# Patient Record
Sex: Male | Born: 1965 | Race: White | Hispanic: No | Marital: Married | Smoking: Never smoker
Health system: Southern US, Community
[De-identification: ages and names within clinical notes are randomized; demographics above are authoritative.]

## PROBLEM LIST (undated history)

## (undated) DIAGNOSIS — I10 Essential (primary) hypertension: Secondary | ICD-10-CM

## (undated) DIAGNOSIS — J45909 Unspecified asthma, uncomplicated: Secondary | ICD-10-CM

## (undated) HISTORY — PX: FOOT SURGERY: SHX648

## (undated) HISTORY — DX: Essential (primary) hypertension: I10

## (undated) HISTORY — DX: Unspecified asthma, uncomplicated: J45.909

---

## 2011-04-28 ENCOUNTER — Emergency Department: Payer: Self-pay | Admitting: Emergency Medicine

## 2015-06-12 DIAGNOSIS — R202 Paresthesia of skin: Secondary | ICD-10-CM | POA: Diagnosis not present

## 2015-06-12 DIAGNOSIS — I10 Essential (primary) hypertension: Secondary | ICD-10-CM | POA: Diagnosis not present

## 2015-06-12 DIAGNOSIS — E0590 Thyrotoxicosis, unspecified without thyrotoxic crisis or storm: Secondary | ICD-10-CM | POA: Diagnosis not present

## 2015-06-12 DIAGNOSIS — R7309 Other abnormal glucose: Secondary | ICD-10-CM | POA: Diagnosis not present

## 2016-02-17 DIAGNOSIS — L309 Dermatitis, unspecified: Secondary | ICD-10-CM | POA: Diagnosis not present

## 2016-02-17 DIAGNOSIS — C4442 Squamous cell carcinoma of skin of scalp and neck: Secondary | ICD-10-CM | POA: Diagnosis not present

## 2016-02-17 DIAGNOSIS — D485 Neoplasm of uncertain behavior of skin: Secondary | ICD-10-CM | POA: Diagnosis not present

## 2016-03-12 DIAGNOSIS — L905 Scar conditions and fibrosis of skin: Secondary | ICD-10-CM | POA: Diagnosis not present

## 2016-03-12 DIAGNOSIS — C4442 Squamous cell carcinoma of skin of scalp and neck: Secondary | ICD-10-CM | POA: Diagnosis not present

## 2016-05-11 DIAGNOSIS — D1801 Hemangioma of skin and subcutaneous tissue: Secondary | ICD-10-CM | POA: Diagnosis not present

## 2016-05-11 DIAGNOSIS — L570 Actinic keratosis: Secondary | ICD-10-CM | POA: Diagnosis not present

## 2016-05-11 DIAGNOSIS — L814 Other melanin hyperpigmentation: Secondary | ICD-10-CM | POA: Diagnosis not present

## 2016-05-11 DIAGNOSIS — Z85828 Personal history of other malignant neoplasm of skin: Secondary | ICD-10-CM | POA: Diagnosis not present

## 2016-05-11 DIAGNOSIS — L821 Other seborrheic keratosis: Secondary | ICD-10-CM | POA: Diagnosis not present

## 2016-06-10 DIAGNOSIS — R062 Wheezing: Secondary | ICD-10-CM | POA: Diagnosis not present

## 2016-06-10 DIAGNOSIS — R202 Paresthesia of skin: Secondary | ICD-10-CM | POA: Diagnosis not present

## 2016-06-10 DIAGNOSIS — I10 Essential (primary) hypertension: Secondary | ICD-10-CM | POA: Diagnosis not present

## 2016-06-18 ENCOUNTER — Encounter: Payer: Self-pay | Admitting: Podiatry

## 2016-06-18 ENCOUNTER — Ambulatory Visit (INDEPENDENT_AMBULATORY_CARE_PROVIDER_SITE_OTHER): Payer: BLUE CROSS/BLUE SHIELD | Admitting: Podiatry

## 2016-06-18 ENCOUNTER — Ambulatory Visit (INDEPENDENT_AMBULATORY_CARE_PROVIDER_SITE_OTHER): Payer: BLUE CROSS/BLUE SHIELD

## 2016-06-18 VITALS — BP 124/73 | HR 76

## 2016-06-18 DIAGNOSIS — M79672 Pain in left foot: Secondary | ICD-10-CM | POA: Diagnosis not present

## 2016-06-18 DIAGNOSIS — M79671 Pain in right foot: Secondary | ICD-10-CM

## 2016-06-18 DIAGNOSIS — M779 Enthesopathy, unspecified: Secondary | ICD-10-CM | POA: Diagnosis not present

## 2016-06-18 NOTE — Progress Notes (Signed)
   Subjective:    Patient ID: Jonathan GuestBrian Hunter, male    DOB: Mar 16, 1965, 51 y.o.   MRN: [id number]  HPI Chief Complaint  Patient presents with  . Foot Pain    B/L foot pain and need orthotic      Review of Systems  Musculoskeletal: Positive for gait problem.       Objective:   Physical Exam        Assessment & Plan:

## 2016-06-18 NOTE — Progress Notes (Signed)
Subjective:    Patient ID: Jonathan GuestBrian Hunter, male   DOB: 51 y.o.   MRN: [id number]   HPI patient presents stating he's worn orthotics for years and his feet have started to hurt and he gets numbing and tingling pain in his feet for about the last year    Review of Systems  All other systems reviewed and are negative.       Objective:  Physical Exam  Constitutional: He is oriented to person, place, and time.  Cardiovascular: Intact distal pulses.   Pulmonary/Chest: Effort normal.  Musculoskeletal: Normal range of motion.  Neurological: He is alert and oriented to person, place, and time.  Skin: Skin is warm.  Nursing note and vitals reviewed.  neurovascular status intact muscle strength adequate range of motion within normal limits with patient noted to have mild tingling of the distal toes bilateral with no muscle strength loss. Range of motion was adequate and no other pathology was noted with moderate depression of the arch and chronic tendinitis with history of navicular fracture right     Assessment:    Chronic tendinitis with fracture right of the navicular that's healed with orthotics which been very beneficial and possibility for low-grade neuropathy     Plan:    H&P x-rays reviewed and today we scanned for custom orthotics to lift the plantar arch and take pressure off the navicular. I then discussed neuropathy and we discussed possibility long-term for nerve biopsy which she does not want at the current time and I'm getting a have him start high-dose vitamin complex and discussed him starting  X-rays indicate there is no indications of stress fracture or advanced arthritis

## 2016-06-22 DIAGNOSIS — L570 Actinic keratosis: Secondary | ICD-10-CM | POA: Diagnosis not present

## 2016-07-13 ENCOUNTER — Ambulatory Visit: Payer: BLUE CROSS/BLUE SHIELD | Admitting: Orthotics

## 2016-08-07 DIAGNOSIS — R7989 Other specified abnormal findings of blood chemistry: Secondary | ICD-10-CM | POA: Diagnosis not present

## 2016-08-07 DIAGNOSIS — R202 Paresthesia of skin: Secondary | ICD-10-CM | POA: Diagnosis not present

## 2016-08-14 ENCOUNTER — Encounter: Payer: Self-pay | Admitting: Neurology

## 2016-08-24 DIAGNOSIS — M25571 Pain in right ankle and joints of right foot: Secondary | ICD-10-CM | POA: Diagnosis not present

## 2016-08-31 DIAGNOSIS — M25571 Pain in right ankle and joints of right foot: Secondary | ICD-10-CM | POA: Diagnosis not present

## 2016-08-31 DIAGNOSIS — Q742 Other congenital malformations of lower limb(s), including pelvic girdle: Secondary | ICD-10-CM | POA: Diagnosis not present

## 2016-08-31 DIAGNOSIS — I10 Essential (primary) hypertension: Secondary | ICD-10-CM | POA: Diagnosis not present

## 2016-08-31 DIAGNOSIS — Z23 Encounter for immunization: Secondary | ICD-10-CM | POA: Diagnosis not present

## 2016-08-31 DIAGNOSIS — L570 Actinic keratosis: Secondary | ICD-10-CM | POA: Diagnosis not present

## 2016-09-10 ENCOUNTER — Other Ambulatory Visit: Payer: Self-pay | Admitting: Family Medicine

## 2016-09-11 ENCOUNTER — Other Ambulatory Visit: Payer: Self-pay | Admitting: Family Medicine

## 2016-09-11 DIAGNOSIS — M899 Disorder of bone, unspecified: Secondary | ICD-10-CM

## 2016-09-12 ENCOUNTER — Ambulatory Visit
Admission: RE | Admit: 2016-09-12 | Discharge: 2016-09-12 | Disposition: A | Discharge status: Home / Self Care | Payer: Self-pay | Source: Ambulatory Visit | Attending: Family Medicine | Admitting: Family Medicine

## 2016-09-12 DIAGNOSIS — M899 Disorder of bone, unspecified: Secondary | ICD-10-CM

## 2016-09-12 DIAGNOSIS — S99911A Unspecified injury of right ankle, initial encounter: Secondary | ICD-10-CM | POA: Diagnosis not present

## 2016-09-12 DIAGNOSIS — M25571 Pain in right ankle and joints of right foot: Secondary | ICD-10-CM | POA: Diagnosis not present

## 2016-09-12 MED ORDER — GADOBENATE DIMEGLUMINE 529 MG/ML IV SOLN
20.0000 mL | Freq: Once | INTRAVENOUS | Status: AC | PRN
  Administered 2016-09-12: 20 mL via INTRAVENOUS

## 2016-09-18 DIAGNOSIS — M84374A Stress fracture, right foot, initial encounter for fracture: Secondary | ICD-10-CM | POA: Diagnosis not present

## 2016-10-21 DIAGNOSIS — M84374D Stress fracture, right foot, subsequent encounter for fracture with routine healing: Secondary | ICD-10-CM | POA: Diagnosis not present

## 2016-10-28 ENCOUNTER — Encounter: Payer: Self-pay | Admitting: Neurology

## 2016-10-28 ENCOUNTER — Ambulatory Visit (INDEPENDENT_AMBULATORY_CARE_PROVIDER_SITE_OTHER): Payer: BLUE CROSS/BLUE SHIELD | Admitting: Neurology

## 2016-10-28 ENCOUNTER — Other Ambulatory Visit: Payer: BLUE CROSS/BLUE SHIELD

## 2016-10-28 VITALS — BP 110/80 | HR 62 | Ht 74.0 in | Wt 219.1 lb

## 2016-10-28 DIAGNOSIS — M84374D Stress fracture, right foot, subsequent encounter for fracture with routine healing: Secondary | ICD-10-CM | POA: Diagnosis not present

## 2016-10-28 DIAGNOSIS — R202 Paresthesia of skin: Secondary | ICD-10-CM

## 2016-10-28 DIAGNOSIS — R200 Anesthesia of skin: Secondary | ICD-10-CM

## 2016-10-28 NOTE — Patient Instructions (Signed)
NCS/EMG of the legs Check labs

## 2016-10-28 NOTE — Progress Notes (Signed)
[facility] Neurology Division Clinic Note - Initial Visit   Date: 10/28/16  Jonathan Hunter MRN: [id number] DOB: 12-Feb-1965   Dear Dr. Kateri Plummer:  Thank you for your kind referral of Jonathan Hunter for consultation of bilateral paresthesias of the feet. Although his history is well known to you, please allow [place] to reiterate it for the purpose of our medical record. The patient was accompanied to the clinic by self.   History of Present Illness: Jonathan Hunter is a 51 y.o. ambidextrous Caucasian male with gout, asthma, and hypertension presenting for evaluation of bilateral feet numbness.    Starting around 2016, he began having tingling/numbness of the toes and mid-foot which is constant.  Symptoms are less bothersome at night and worse with constrictive socks or shoes.   He denies any low back pain or imbalance.  He was started on irbesartan  daily and feels that symptoms started after he took this medication.  His feet get cold easily, too.   Today, he also has gout involving the right ankle and a stress fracture involving the right navicular bone.  He used to be an avid runner.   He drinks 2-3 beers nightly for about 20-30 years and reduced this to only the weekends about 4 months ago.  He denies personal history of diabetes or family history of neuropathy.   Out-side paper records, electronic medical record, and images have been reviewed where available and summarized as:  Labs 08/07/2016: TSH 1.83, RPR neg, folate > 20, vitamin B12 972  MRI right ankle wwo contrast 09/13/2016: 1. The lesion of concern on radiographs appears to reflect a postsurgical defect, probably from the reported previous remote navicular surgery. This has no worrisome characteristics. 2. Abnormal navicular, in part secondary to previous surgery. There is diffuse marrow edema and enhancement with suspicion of a recurrent stress fracture. 3. Associated edema and enhancement throughout the surrounding soft  tissues, including the tarsal sinus consistent with synovitis. Complex talonavicular joint effusion with synovial enhancement. There are no suspicious findings within the surrounding bones to suggest infection. 4. The ankle tendons and ligaments are intact.  Past Medical History:  Diagnosis Date  . Asthma   . Hypertension     Past Surgical History:  Procedure Laterality Date  . FOOT SURGERY       Medications:  Outpatient Encounter Prescriptions as of 10/28/2016  Medication Sig  . fluorouracil (EFUDEX) 5 % cream APPLY TO AFFECTED AREA EXTERNALLY DAILY FOR 2 WEEKS  . indomethacin (INDOCIN) 25 MG capsule 1 CAP BY MOUTH 3X A DAY FOR 3 DAYS. THEN 1 CAP 2X A DAY FOR 3 DAYS THEN 1 CAP EVERY DAY FOR 3 DAYS  . irbesartan (AVAPRO) 300 MG tablet TAKE ONE TABLET BY MOUTH ONE TIME DAILY.MUST FIND [place] DOCTOR  . SYMBICORT 80-4.5 MCG/ACT inhaler Inhale 2 puffs into the lungs 2 (two) times daily.   No facility-administered encounter medications on file as of 10/28/2016.      Allergies: No Known Allergies  Family History: Family History  Problem Relation Age of Onset  . Prostate cancer Father     Social History: Social History  Substance Use Topics  . Smoking status: Never Smoker  . Smokeless tobacco: Never Used  . Alcohol use No   Social History   Social History Narrative  . No narrative on file    Review of Systems:  CONSTITUTIONAL: No fevers, chills, night sweats, or weight loss.   EYES: No visual changes or eye pain ENT: No hearing changes.  No history of nose bleeds.   RESPIRATORY: No cough, wheezing and shortness of breath.   CARDIOVASCULAR: Negative for chest pain, and palpitations.   GI: Negative for abdominal discomfort, blood in stools or black stools.  No recent change in bowel habits.   GU:  No history of incontinence.   MUSCLOSKELETAL: +history of joint pain or swelling.  No myalgias.   SKIN: Negative for lesions, rash, and itching.   HEMATOLOGY/ONCOLOGY: Negative  for prolonged bleeding, bruising easily, and swollen nodes.  No history of cancer.   ENDOCRINE: Negative for cold or heat intolerance, polydipsia or goiter.   PSYCH:  No depression or anxiety symptoms.   NEURO: As Above.   Vital Signs:  BP 110/80   Pulse 62   Ht  (1.88 m)   Wt 219 lb 2 oz (99.4 kg)   SpO2 98%   BMI 28.13 kg/m  P0ain Scale: 0 on a scale of 0-10   General Medical Exam:   General:  Well appearing, comfortable.   Eyes/ENT: see cranial nerve examination.   Neck: No masses appreciated.  Full range of motion without tenderness.  No carotid bruits. Respiratory:  Clear to auscultation, good air entry bilaterally.   Cardiac:  Regular rate and rhythm, no murmur.   Extremities:  No deformities, edema, or skin discoloration.  Skin:  No rashes or lesions.  Neurological Exam: MENTAL STATUS including orientation to time, place, person, recent and remote memory, attention span and concentration, language, and fund of knowledge is normal.  Speech is not dysarthric.  CRANIAL NERVES: II:  No visual field defects.  Unremarkable fundi.   III-IV-VI: Pupils equal round and reactive to light.  Normal conjugate, extra-ocular eye movements in all directions of gaze.  No nystagmus.  No ptosis.   V:  Normal facial sensation.     VII:  Normal facial symmetry and movements.  VIII:  Normal hearing and vestibular function.   IX-X:  Normal palatal movement.   XI:  Normal shoulder shrug and head rotation.   XII:  Normal tongue strength and range of motion, no deviation or fasciculation.  MOTOR:  No atrophy, fasciculations or abnormal movements.  No pronator drift.  Tone is normal.    Right Upper Extremity:    Left Upper Extremity:    Deltoid  5/5   Deltoid  5/5   Biceps  5/5   Biceps  5/5   Triceps  5/5   Triceps  5/5   Wrist extensors  5/5   Wrist extensors  5/5   Wrist flexors  5/5   Wrist flexors  5/5   Finger extensors  5/5   Finger extensors  5/5   Finger flexors  5/5   Finger  flexors  5/5   Dorsal interossei  5/5   Dorsal interossei  5/5   Abductor pollicis  5/5   Abductor pollicis  5/5   Tone (Ashworth scale)  0  Tone (Ashworth scale)  0   Right Lower Extremity:    Left Lower Extremity:    Hip flexors  5/5   Hip flexors  5/5   Hip extensors  5/5   Hip extensors  5/5   Knee flexors  5/5   Knee flexors  5/5   Knee extensors  5/5   Knee extensors  5/5   Dorsiflexors  5/5   Dorsiflexors  5/5   Plantarflexors  5/5   Plantarflexors  5/5   Toe extensors  5/5   Toe extensors  5/5  Toe flexors  5/5   Toe flexors  5/5   Tone (Ashworth scale)  0  Tone (Ashworth scale)  0   MSRs:  Right                                                                 Left brachioradialis 2+  brachioradialis 2+  biceps 2+  biceps 2+  triceps 2+  triceps 2+  patellar 2+  patellar 2+  ankle jerk 2+  ankle jerk 2+  Hoffman no  Hoffman no  plantar response down  plantar response down   SENSORY:  Reduced pin prick, temperature, and vibration at distal to mid-foot and into the toes.  Romberg's sign absent.   COORDINATION/GAIT: Normal finger-to- nose-finger.  Intact rapid alternating movements bilaterally. Gait is antalgic due to right ankle pain (gout)   IMPRESSION: Mr. Gerst is a delightful 51 year-old man referred for evaluation of bilateral feet paresthesias. 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  He is some what young for the onset of neuropathy, especially without family history or personal history of diabetes.  Further testing with NCS/EMG of the legs is recommended to better understand the nature of his symptoms.  Lumbar radiculopathy seems less likely in the absence of radicular pain.  He endorses drinking 2-3 beers daily for a number of years and reduced this to only over the weekends about 3 months ago.  Alcohol may certainly cause neuropathy and he was encouraged to reduce this. Prior work-up has included TSH, vitamin B12,  folate, and screening for diabetes which has been normal.  I would like to test for a few additional treatable causes of neuropathy.   PLAN/RECOMMENDATIONS:  1.  Check copper, SPEP with IFE, vitamin B1 2.  NCS/EMG of the legs  Further recommendations will be based on his results  Thank you for allowing me to participate in patient's care.  If I can answer any additional questions, I would be pleased to do so.    Sincerely,    Baraa Tubbs K. Allena Katz, DO

## 2016-11-02 ENCOUNTER — Telehealth: Payer: Self-pay | Admitting: *Deleted

## 2016-11-02 LAB — PROTEIN ELECTROPHORESIS, SERUM
ALPHA 1: 0.2 g/dL (ref 0.2–0.3)
ALPHA 2: 0.6 g/dL (ref 0.5–0.9)
Albumin ELP: 4.8 g/dL (ref 3.8–4.8)
BETA 2: 0.4 g/dL (ref 0.2–0.5)
Beta Globulin: 0.4 g/dL (ref 0.4–0.6)
GAMMA GLOBULIN: 1 g/dL (ref 0.8–1.7)
Total Protein: 7.5 g/dL (ref 6.1–8.1)

## 2016-11-02 LAB — COPPER, SERUM: COPPER: 91 ug/dL (ref 70–175)

## 2016-11-02 LAB — IMMUNOFIXATION ELECTROPHORESIS
IMMUNOFIX ELECTR INT: NOT DETECTED
IMMUNOGLOBULIN A: 271 mg/dL (ref 81–463)
IgG (Immunoglobin G), Serum: 1088 mg/dL (ref 694–1618)
IgM, Serum: 70 mg/dL (ref 48–271)

## 2016-11-02 LAB — VITAMIN B1: Vitamin B1 (Thiamine): 10 nmol/L (ref 8–30)

## 2016-11-02 NOTE — Telephone Encounter (Signed)
-----   Message from Glendale Chard, DO sent at 11/02/2016  1:04 PM EDT ----- Please notify patient lab are within normal limits.  Thank you.

## 2016-11-02 NOTE — Telephone Encounter (Signed)
Left message informing patient that labs are normal.  

## 2016-11-06 DIAGNOSIS — M84374D Stress fracture, right foot, subsequent encounter for fracture with routine healing: Secondary | ICD-10-CM | POA: Diagnosis not present

## 2016-11-13 DIAGNOSIS — M84374D Stress fracture, right foot, subsequent encounter for fracture with routine healing: Secondary | ICD-10-CM | POA: Diagnosis not present

## 2016-11-17 ENCOUNTER — Encounter: Payer: BLUE CROSS/BLUE SHIELD | Admitting: Neurology

## 2016-11-20 DIAGNOSIS — M84374D Stress fracture, right foot, subsequent encounter for fracture with routine healing: Secondary | ICD-10-CM | POA: Diagnosis not present

## 2016-12-07 DIAGNOSIS — M84374A Stress fracture, right foot, initial encounter for fracture: Secondary | ICD-10-CM | POA: Diagnosis not present

## 2016-12-08 ENCOUNTER — Ambulatory Visit (INDEPENDENT_AMBULATORY_CARE_PROVIDER_SITE_OTHER): Payer: BLUE CROSS/BLUE SHIELD | Admitting: Neurology

## 2016-12-08 DIAGNOSIS — G629 Polyneuropathy, unspecified: Secondary | ICD-10-CM

## 2016-12-08 DIAGNOSIS — R200 Anesthesia of skin: Secondary | ICD-10-CM | POA: Diagnosis not present

## 2016-12-08 NOTE — Procedures (Signed)
[facility]  [address], [address]  [place], [place] [address] Tel: [phone] Fax:  [phone]) [phone] Test Date:  12/08/2016  Patient: Jonathan GuestBrian Hunter DOB: Jun 16, 1965 Physician: Nita Sickleonika Lygia Olaes, DO  Sex: Male Height: 6\' 2"  Ref Phys: Nita Sickleonika Rashel Okeefe, DO  ID#: [id number] Temp: 33.2C Technician:    Patient Complaints: This is a 51 year-old man referred for evaluation of bilateral feet pain and paresthesias.  NCV & EMG Findings: Extensive electrodiagnostic testing of the right lower extremity and additional studies of the left shows:  1. Bilateral superficial peroneal sensory responses are absent. Bilateral sural sensory responses are within normal limits. 2. Bilateral peroneal motor responses at the extensor digitorum brevis are absent, and normal at the tibialis anterior. Bilateral tibial motor responses show reduced amplitude, and there is conduction velocity slowing on the left. 3. Bilateral tibial H reflex studies show prolonged latencies. 4. There is no evidence of active or chronic motor axon loss changes affecting any of the tested muscles. Motor unit configuration and recruitment pattern is within normal limits.  Impression: The electrophysiologic findings are most consistent with a distal and symmetric sensorimotor polyneuropathy, axon loss in type, affecting the lower extremities. Overall, these findings are moderate in degree electrically.   ___________________________ Nita Sickleonika Rosmery Duggin, DO    Nerve Conduction Studies Anti Sensory Summary Table   Stim Site NR Peak (ms) Norm Peak (ms) P-T Amp (V) Norm P-T Amp  Left Sup Peroneal Anti Sensory (Ant Lat Mall)  12 cm NR  <4.5  >5  Right Sup Peroneal Anti Sensory (Ant Lat Mall)  12 cm NR  <4.5  >5  Left Sural Anti Sensory (Lat Mall)  Calf    3.7 <4.5 8.6 >5  Right Sural Anti Sensory (Lat Mall)  Calf    3.3 <4.5 9.2 >5   Motor Summary Table   Stim Site NR Onset (ms) Norm Onset (ms) O-P Amp (mV) Norm O-P Amp Site1 Site2  Delta-0 (ms) Dist (cm) Vel (m/s) Norm Vel (m/s)  Left Peroneal Motor (Ext Dig Brev)  Ankle NR  <5.5  >3 B Fib Ankle  39.0  >40  B Fib NR     Poplt B Fib  8.0  >40  Poplt NR            Right Peroneal Motor (Ext Dig Brev)  Ankle NR  <5.5  >3 B Fib Ankle  0.0  >40  B Fib NR     Poplt B Fib  0.0  >40  Poplt NR            Left Peroneal TA Motor (Tib Ant)  Fib Head    3.4 <4.0 5.2 >4 Poplit Fib Head 1.1 7.0 64 >40  Poplit    4.5  5.1         Right Peroneal TA Motor (Tib Ant)  Fib Head    3.7 <4.0 6.0 >4 Poplit Fib Head 1.1 7.0 64 >40  Poplit    4.8  5.7         Left Tibial Motor (Abd Hall Brev)  Ankle    4.3 <6.0 3.5 >8 Knee Ankle 12.8 40.0 31 >40  Knee    17.1  2.0         Right Tibial Motor (Abd Hall Brev)  Ankle    5.3 <6.0 4.5 >8 Knee Ankle 9.7 43.0 44 >40  Knee    15.0  2.3          H Reflex Studies   NR  H-Lat (ms) Lat Norm (ms) L-R H-Lat (ms)  Left Tibial (Gastroc)     40.00 <35 0.00  Right Tibial (Gastroc)     40.00 <35 0.00   EMG   Side Muscle Ins Act Fibs Psw Fasc Number Recrt Dur Dur. Amp Amp. Poly Poly. Comment  Left AntTibialis Nml Nml Nml Nml Nml Nml Nml Nml Nml Nml Nml Nml N/A  Left Gastroc Nml Nml Nml Nml Nml Nml Nml Nml Nml Nml Nml Nml N/A  Left Flex Dig Long Nml Nml Nml Nml Nml Nml Nml Nml Nml Nml Nml Nml N/A  Left RectFemoris Nml Nml Nml Nml Nml Nml Nml Nml Nml Nml Nml Nml N/A  Left GluteusMed Nml Nml Nml Nml Nml Nml Nml Nml Nml Nml Nml Nml N/A  Right AntTibialis Nml Nml Nml Nml Nml Nml Nml Nml Nml Nml Nml Nml N/A  Right Gastroc Nml Nml Nml Nml Nml Nml Nml Nml Nml Nml Nml Nml N/A  Right Flex Dig Long Nml Nml Nml Nml Nml Nml Nml Nml Nml Nml Nml Nml N/A  Right RectFemoris Nml Nml Nml Nml Nml Nml Nml Nml Nml Nml Nml Nml N/A  Right GluteusMed Nml Nml Nml Nml Nml Nml Nml Nml Nml Nml Nml Nml N/A  Right Lumbo Parasp Low Nml Nml Nml Nml Nml Nml Nml Nml Nml Nml Nml Nml N/A      Waveforms:

## 2016-12-09 ENCOUNTER — Telehealth: Payer: Self-pay | Admitting: *Deleted

## 2016-12-09 NOTE — Telephone Encounter (Signed)
Left message for patient to call me back. 

## 2016-12-09 NOTE — Telephone Encounter (Signed)
-----  Message from Donika K Patel, DO sent at 12/08/2016  6:08 PM EST ----- Please inform patient and his nerve testing to show neuropathy affecting the feet. We can check a few additional labs looking for causes of neuropathy - heavy metal screen, ESR, CRP, ACE.  If these return normal, symptoms may be due to chronic effects of alcohol. If symptoms get worse, further testing may be indicated. 

## 2016-12-14 ENCOUNTER — Telehealth: Payer: Self-pay | Admitting: *Deleted

## 2016-12-14 ENCOUNTER — Other Ambulatory Visit: Payer: Self-pay | Admitting: *Deleted

## 2016-12-14 DIAGNOSIS — G629 Polyneuropathy, unspecified: Secondary | ICD-10-CM

## 2016-12-14 DIAGNOSIS — R200 Anesthesia of skin: Secondary | ICD-10-CM

## 2016-12-14 NOTE — Telephone Encounter (Signed)
Patient given results and instructions.  He will come in this week for lab work.

## 2016-12-14 NOTE — Telephone Encounter (Signed)
-----  Message from Donika K Patel, DO sent at 12/08/2016  6:08 PM EST ----- Please inform patient and his nerve testing to show neuropathy affecting the feet. We can check a few additional labs looking for causes of neuropathy - heavy metal screen, ESR, CRP, ACE.  If these return normal, symptoms may be due to chronic effects of alcohol. If symptoms get worse, further testing may be indicated. 

## 2017-01-05 DIAGNOSIS — M19171 Post-traumatic osteoarthritis, right ankle and foot: Secondary | ICD-10-CM | POA: Diagnosis not present

## 2017-02-12 DIAGNOSIS — M79671 Pain in right foot: Secondary | ICD-10-CM | POA: Diagnosis not present

## 2017-02-12 DIAGNOSIS — M19071 Primary osteoarthritis, right ankle and foot: Secondary | ICD-10-CM | POA: Diagnosis not present

## 2017-02-12 DIAGNOSIS — M84374G Stress fracture, right foot, subsequent encounter for fracture with delayed healing: Secondary | ICD-10-CM | POA: Diagnosis not present

## 2017-02-22 DIAGNOSIS — Z0000 Encounter for general adult medical examination without abnormal findings: Secondary | ICD-10-CM | POA: Diagnosis not present

## 2017-02-22 DIAGNOSIS — Z13220 Encounter for screening for lipoid disorders: Secondary | ICD-10-CM | POA: Diagnosis not present

## 2017-02-22 DIAGNOSIS — M109 Gout, unspecified: Secondary | ICD-10-CM | POA: Diagnosis not present

## 2017-02-22 DIAGNOSIS — I10 Essential (primary) hypertension: Secondary | ICD-10-CM | POA: Diagnosis not present

## 2017-02-22 DIAGNOSIS — Z125 Encounter for screening for malignant neoplasm of prostate: Secondary | ICD-10-CM | POA: Diagnosis not present

## 2017-02-23 ENCOUNTER — Telehealth: Payer: Self-pay | Admitting: Neurology

## 2017-02-23 NOTE — Telephone Encounter (Signed)
Pt has requested that Dr Allena KatzPatel send Dr Satira SarkAron Morrow the EMG results because he said he wants to investigate them more

## 2017-02-23 NOTE — Telephone Encounter (Signed)
Faxed results.

## 2017-05-04 DIAGNOSIS — D1801 Hemangioma of skin and subcutaneous tissue: Secondary | ICD-10-CM | POA: Diagnosis not present

## 2017-05-04 DIAGNOSIS — L821 Other seborrheic keratosis: Secondary | ICD-10-CM | POA: Diagnosis not present

## 2017-05-04 DIAGNOSIS — L814 Other melanin hyperpigmentation: Secondary | ICD-10-CM | POA: Diagnosis not present

## 2017-05-04 DIAGNOSIS — L570 Actinic keratosis: Secondary | ICD-10-CM | POA: Diagnosis not present

## 2017-05-04 DIAGNOSIS — D2261 Melanocytic nevi of right upper limb, including shoulder: Secondary | ICD-10-CM | POA: Diagnosis not present

## 2017-05-31 DIAGNOSIS — M84374D Stress fracture, right foot, subsequent encounter for fracture with routine healing: Secondary | ICD-10-CM | POA: Diagnosis not present

## 2017-05-31 DIAGNOSIS — M79671 Pain in right foot: Secondary | ICD-10-CM | POA: Diagnosis not present

## 2017-08-31 DIAGNOSIS — W57XXXA Bitten or stung by nonvenomous insect and other nonvenomous arthropods, initial encounter: Secondary | ICD-10-CM | POA: Diagnosis not present

## 2017-08-31 DIAGNOSIS — L570 Actinic keratosis: Secondary | ICD-10-CM | POA: Diagnosis not present

## 2017-08-31 DIAGNOSIS — S80861A Insect bite (nonvenomous), right lower leg, initial encounter: Secondary | ICD-10-CM | POA: Diagnosis not present

## 2017-08-31 DIAGNOSIS — S80862A Insect bite (nonvenomous), left lower leg, initial encounter: Secondary | ICD-10-CM | POA: Diagnosis not present

## 2017-09-02 ENCOUNTER — Telehealth: Payer: Self-pay | Admitting: Neurology

## 2017-09-02 DIAGNOSIS — R202 Paresthesia of skin: Secondary | ICD-10-CM | POA: Diagnosis not present

## 2017-09-02 DIAGNOSIS — J309 Allergic rhinitis, unspecified: Secondary | ICD-10-CM | POA: Diagnosis not present

## 2017-09-02 DIAGNOSIS — I10 Essential (primary) hypertension: Secondary | ICD-10-CM | POA: Diagnosis not present

## 2017-09-02 NOTE — Telephone Encounter (Signed)
Note faxed.

## 2017-09-02 NOTE — Telephone Encounter (Signed)
Jonathan Hunter called from [facility] wanting a copy of the last neurological consult note. Her fax number is [phone] FAO:[email or web address]:Jonathan Hunter. If you need to call her back its [phone]. Thanks.

## 2017-09-06 DIAGNOSIS — Z1211 Encounter for screening for malignant neoplasm of colon: Secondary | ICD-10-CM | POA: Diagnosis not present

## 2017-09-06 DIAGNOSIS — K6389 Other specified diseases of intestine: Secondary | ICD-10-CM | POA: Diagnosis not present

## 2017-09-06 DIAGNOSIS — K635 Polyp of colon: Secondary | ICD-10-CM | POA: Diagnosis not present

## 2017-09-06 DIAGNOSIS — K6289 Other specified diseases of anus and rectum: Secondary | ICD-10-CM | POA: Diagnosis not present

## 2017-09-08 DIAGNOSIS — Z1211 Encounter for screening for malignant neoplasm of colon: Secondary | ICD-10-CM | POA: Diagnosis not present

## 2017-09-08 DIAGNOSIS — K6389 Other specified diseases of intestine: Secondary | ICD-10-CM | POA: Diagnosis not present

## 2017-09-08 DIAGNOSIS — K635 Polyp of colon: Secondary | ICD-10-CM | POA: Diagnosis not present

## 2017-11-23 DIAGNOSIS — B078 Other viral warts: Secondary | ICD-10-CM | POA: Diagnosis not present

## 2017-11-23 DIAGNOSIS — L570 Actinic keratosis: Secondary | ICD-10-CM | POA: Diagnosis not present

## 2017-12-24 ENCOUNTER — Encounter

## 2017-12-24 ENCOUNTER — Other Ambulatory Visit (INDEPENDENT_AMBULATORY_CARE_PROVIDER_SITE_OTHER): Payer: BLUE CROSS/BLUE SHIELD

## 2017-12-24 ENCOUNTER — Ambulatory Visit: Payer: BLUE CROSS/BLUE SHIELD | Admitting: Neurology

## 2017-12-24 ENCOUNTER — Encounter: Payer: Self-pay | Admitting: Neurology

## 2017-12-24 VITALS — BP 130/80 | HR 88 | Ht 74.0 in | Wt 244.0 lb

## 2017-12-24 DIAGNOSIS — G629 Polyneuropathy, unspecified: Secondary | ICD-10-CM

## 2017-12-24 LAB — C-REACTIVE PROTEIN: CRP: 0.3 mg/dL — AB (ref 0.5–20.0)

## 2017-12-24 LAB — SEDIMENTATION RATE: SED RATE: 11 mm/h (ref 0–20)

## 2017-12-24 NOTE — Patient Instructions (Signed)
Check labs  Start balance exercises  Try to cut back, if not stop, alcohol consumption  Return to clinic in 6 months

## 2017-12-24 NOTE — Progress Notes (Signed)
  Follow-up Visit   Date: 12/24/17    Jonathan Hunter MRN: [id number] DOB: 01/02/1966   Interim History: Jonathan Hunter is a 52 y.o. gout, asthma, hypothyroidism, and hypertension returning to the clinic for follow-up of neuropathy.  The patient was accompanied to the clinic by self.  History of present illness: Starting around 2016, he began having tingling/numbness of the toes and mid-foot which is constant.  Symptoms are less bothersome at night and worse with constrictive socks or shoes.   He denies any low back pain or imbalance.  He was started on irbesartan 150mg daily and feels that symptoms started after he took this medication.  His feet get cold easily, too.  He also has gout involving the right ankle and a stress fracture involving the right navicular bone.  He used to be an avid runner.   He drinks 2-3 beers nightly for about 20-30 years and reduced this to only the weekends about 4 months ago.  He denies personal history of diabetes or family history of neuropathy.  UPDATE 12/24/2017:  He is here for follow-up of neuropathy.  Over the past year, he continues to have numbness of the feet and spells of tingling.  Tingling can be triggered by various shoes.  It does not keep him from sleeping and is overall more annoyed than bothered by the pain. He continues to drink 2-3 beers nightly, more on social occasions. No family history of neuropathy. No back pain or weakness.    Medications:  Current Outpatient Medications on File Prior to Visit  Medication Sig Dispense Refill  . allopurinol (ZYLOPRIM) 300 MG tablet Take 300 mg by mouth daily.  3  . Avanafil (STENDRA) 100 MG TABS Stendra 100 mg tablet    . indomethacin (INDOCIN) 25 MG capsule 1 CAP BY MOUTH 3X A DAY FOR 3 DAYS. THEN 1 CAP 2X A DAY FOR 3 DAYS THEN 1 CAP EVERY DAY FOR 3 DAYS  1  . irbesartan (AVAPRO) 300 MG tablet TAKE ONE TABLET BY MOUTH ONE TIME DAILY.MUST FIND  DOCTOR  2  . SYMBICORT 80-4.5 MCG/ACT inhaler Inhale 2  puffs into the lungs 2 (two) times daily.  5   No current facility-administered medications on file prior to visit.     Allergies: No Known Allergies  Review of Systems:  CONSTITUTIONAL: No fevers, chills, night sweats, or weight loss.  EYES: No visual changes or eye pain ENT: No hearing changes.  No history of nose bleeds.   RESPIRATORY: No cough, wheezing and shortness of breath.   CARDIOVASCULAR: Negative for chest pain, and palpitations.   GI: Negative for abdominal discomfort, blood in stools or black stools.  No recent change in bowel habits.   GU:  No history of incontinence.   MUSCLOSKELETAL: No history of joint pain or swelling.  No myalgias.   SKIN: Negative for lesions, rash, and itching.   ENDOCRINE: Negative for cold or heat intolerance, polydipsia or goiter.   PSYCH:  No depression or anxiety symptoms.   NEURO: As Above.   Vital Signs:  BP 130/80   Pulse 88   Ht 6' 2" (1.88 m)   Wt 244 lb (110.7 kg)   SpO2 98%   BMI 31.33 kg/m    General Medical Exam:   General:  Well appearing, comfortable  Eyes/ENT: see cranial nerve examination.   Neck: No masses appreciated.  Full range of motion without tenderness.  No carotid bruits. Respiratory:  Clear to auscultation, good air entry bilaterally.     Cardiac:  Regular rate and rhythm, no murmur.   Ext:  No edema  Neurological Exam: MENTAL STATUS including orientation to time, place, person, recent and remote memory, attention span and concentration, language, and fund of knowledge is normal.  Speech is not dysarthric.  CRANIAL NERVES: Pupils equal round and reactive to light.  Normal conjugate, extra-ocular eye movements in all directions of gaze.  No ptosis Face is symmetric. Palate elevates symmetrically.  Tongue is midline.  MOTOR:  Motor strength is 5/5 in all extremities.  No atrophy, fasciculations or abnormal movements.  No pronator drift.  Tone is normal.    MSRs:  Reflexes are 2+/4 throughout, except absent  Achilles bilaterally.  SENSORY:  Reduced vibration at the great toe bilaterally, temperature is intact.  COORDINATION/GAIT:  Normal finger-to- nose-finger and heel-to-shin.  Intact rapid alternating movements bilaterally.  Gait narrow based and stable.  Unsteady with tandem gait.  Data: Labs 08/07/2016: TSH 1.83, RPR neg, folate > 20, vitamin B12 972  MRI right ankle wwo contrast 09/13/2016: 1. The lesion of concern on radiographs appears to reflect a postsurgical defect, probably from the reported previous remote navicular surgery. This has no worrisome characteristics. 2. Abnormal navicular, in part secondary to previous surgery. There is diffuse marrow edema and enhancement with suspicion of a recurrent stress fracture. 3. Associated edema and enhancement throughout the surrounding soft tissues, including the tarsal sinus consistent with synovitis. Complex talonavicular joint effusion with synovial enhancement. There are no suspicious findings within the surrounding bones to suggest infection. 4. The ankle tendons and ligaments are intact.  NCS/EMG of the legs 12/08/2017: The electrophysiologic findings are most consistent with a distal and symmetric sensorimotor polyneuropathy, axon loss in type, affecting the lower extremities. Overall, these findings are moderate in degree electrically.  Labs 10/28/2016:  Copper 91, vitamin B1 10, SPEP no M protein  IMPRESSION/PLAN: Neuropathy manifesting with bilateral feet numbness and episodic tingling, most likely contributed by alcohol. Results from his NCS/EMG was reviewed and discussed.  I will check a few additional labs looking for secondary causes - vitamin B1, vitamin B12, MMA, ESR, CRP, ACE.  He was instructed to try to reduce, if not stop, alcohol consumption to see if this helps.  Paresthesias are not painful to warrant medications at this time.  He also has mild sensory ataxia and was encouraged to start balance exercises.    Return to  clinic in 6 months   Thank you for allowing me to participate in patient's care.  If I can answer any additional questions, I would be pleased to do so.    Sincerely,    Joci Dress K. Posey Pronto, DO

## 2017-12-27 LAB — VITAMIN B12: Vitamin B-12: 353 pg/mL (ref 211–911)

## 2017-12-28 LAB — ANGIOTENSIN CONVERTING ENZYME: Angiotensin-Converting Enzyme: 111 U/L — ABNORMAL HIGH (ref 9–67)

## 2017-12-28 LAB — VITAMIN B1: Vitamin B1 (Thiamine): 9 nmol/L (ref 8–30)

## 2017-12-28 LAB — METHYLMALONIC ACID, SERUM: Methylmalonic Acid, Quant: 305 nmol/L (ref 87–318)

## 2018-01-03 ENCOUNTER — Telehealth: Payer: Self-pay | Admitting: *Deleted

## 2018-01-03 DIAGNOSIS — R899 Unspecified abnormal finding in specimens from other organs, systems and tissues: Secondary | ICD-10-CM

## 2018-01-03 NOTE — Telephone Encounter (Signed)
-----   Message from Glendale Chardonika K Patel, DO sent at 12/30/2017  4:54 PM EST ----- Please inform patient that all his labs are normal, except there is mild elevation in one lab (ACE) which is nonspecific and sometimes associated with lung conditions and neuropathy.  Does he have complaints of chronic cough or shortness of breath?  If no, let's plan to recheck ACE in 4 weeks.  Thanks.

## 2018-01-03 NOTE — Telephone Encounter (Signed)
Left message giving patient results and instructions.  Requested for him to call me if he is having any SOB or cough.  Will mail lab reminder to him.

## 2018-01-25 DIAGNOSIS — D0439 Carcinoma in situ of skin of other parts of face: Secondary | ICD-10-CM | POA: Diagnosis not present

## 2018-01-25 DIAGNOSIS — L219 Seborrheic dermatitis, unspecified: Secondary | ICD-10-CM | POA: Diagnosis not present

## 2018-01-25 DIAGNOSIS — L570 Actinic keratosis: Secondary | ICD-10-CM | POA: Diagnosis not present

## 2018-01-25 DIAGNOSIS — D485 Neoplasm of uncertain behavior of skin: Secondary | ICD-10-CM | POA: Diagnosis not present

## 2018-03-02 DIAGNOSIS — E785 Hyperlipidemia, unspecified: Secondary | ICD-10-CM | POA: Diagnosis not present

## 2018-03-02 DIAGNOSIS — M109 Gout, unspecified: Secondary | ICD-10-CM | POA: Diagnosis not present

## 2018-03-02 DIAGNOSIS — Z0000 Encounter for general adult medical examination without abnormal findings: Secondary | ICD-10-CM | POA: Diagnosis not present

## 2018-03-02 DIAGNOSIS — R202 Paresthesia of skin: Secondary | ICD-10-CM | POA: Diagnosis not present

## 2018-03-02 DIAGNOSIS — Z125 Encounter for screening for malignant neoplasm of prostate: Secondary | ICD-10-CM | POA: Diagnosis not present

## 2018-03-31 DIAGNOSIS — D0439 Carcinoma in situ of skin of other parts of face: Secondary | ICD-10-CM | POA: Diagnosis not present

## 2018-06-15 ENCOUNTER — Encounter: Payer: Self-pay | Admitting: Neurology

## 2018-07-08 ENCOUNTER — Ambulatory Visit: Payer: BLUE CROSS/BLUE SHIELD | Admitting: Neurology

## 2018-09-16 DIAGNOSIS — G629 Polyneuropathy, unspecified: Secondary | ICD-10-CM | POA: Diagnosis not present

## 2018-09-16 DIAGNOSIS — M109 Gout, unspecified: Secondary | ICD-10-CM | POA: Diagnosis not present

## 2018-09-16 DIAGNOSIS — Z113 Encounter for screening for infections with a predominantly sexual mode of transmission: Secondary | ICD-10-CM | POA: Diagnosis not present

## 2018-09-16 DIAGNOSIS — I10 Essential (primary) hypertension: Secondary | ICD-10-CM | POA: Diagnosis not present

## 2018-10-03 DIAGNOSIS — Z113 Encounter for screening for infections with a predominantly sexual mode of transmission: Secondary | ICD-10-CM | POA: Diagnosis not present

## 2018-11-02 DIAGNOSIS — Z85828 Personal history of other malignant neoplasm of skin: Secondary | ICD-10-CM | POA: Diagnosis not present

## 2018-11-02 DIAGNOSIS — D2261 Melanocytic nevi of right upper limb, including shoulder: Secondary | ICD-10-CM | POA: Diagnosis not present

## 2018-11-02 DIAGNOSIS — L814 Other melanin hyperpigmentation: Secondary | ICD-10-CM | POA: Diagnosis not present

## 2018-11-02 DIAGNOSIS — Z23 Encounter for immunization: Secondary | ICD-10-CM | POA: Diagnosis not present

## 2018-11-02 DIAGNOSIS — L821 Other seborrheic keratosis: Secondary | ICD-10-CM | POA: Diagnosis not present

## 2019-01-24 IMAGING — MR MR ANKLE*R* WO/W CM
9 series · 40 of 40 positions shown · IV contrast (multihance)
Comparison: Right foot radiographs 06/18/2016. No other comparison
studies available.

CLINICAL DATA: Right ankle pain for 3 weeks after running injury.
1.8 cm lytic lesion of the right tibial metaphysis. History of
surgery for navicular stress fracture in 2177. Creatinine was
obtained on site at [HOSPITAL] at [REDACTED]Results: Creatinine 0.9 mg/dL.

EXAM:
MRI OF THE RIGHT ANKLE WITHOUT AND WITH CONTRAST
TECHNIQUE: Multiplanar, multisequence MR imaging of the ankle was performed
before and after the administration of intravenous contrast.
CONTRAST:  20mL MULTIHANCE GADOBENATE DIMEGLUMINE 529 MG/ML IV SOLN

[Series 3: T2 fat-sat · axial · 3.0mm · 0.56mm/px · z∈[-67,+73]mm · 6 of 37 slices shown (1 of 3)]
[im 1/37]
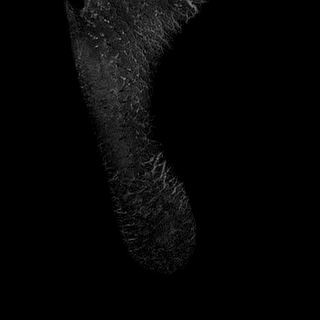
[im 8/37]
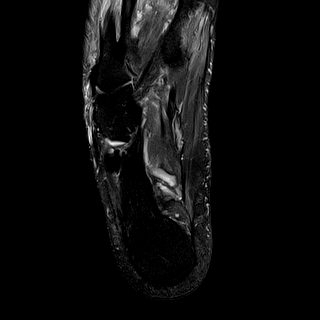
[im 15/37]
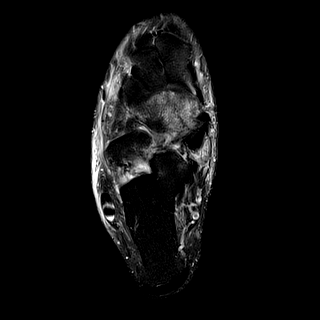
[im 22/37]
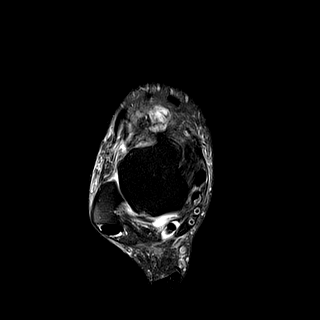
[im 29/37]
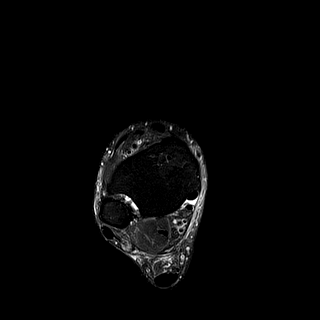
[im 37/37]
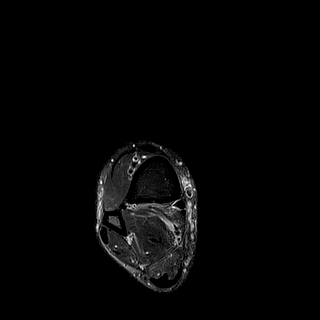

[Series 4: PD fat-sat · axial · 3.0mm · 0.56mm/px · z∈[-67,+73]mm · 6 of 37 slices shown]
[im 1/37]
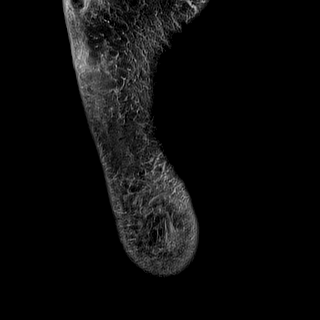
[im 8/37]
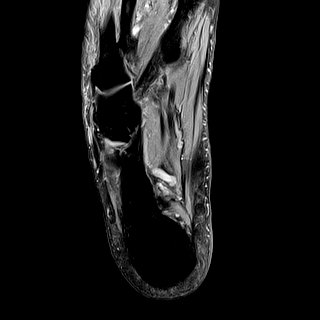
[im 15/37]
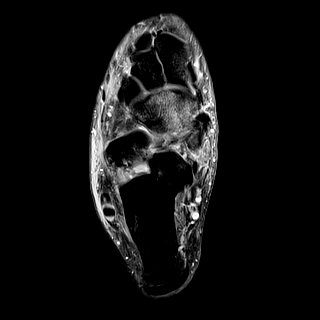
[im 22/37]
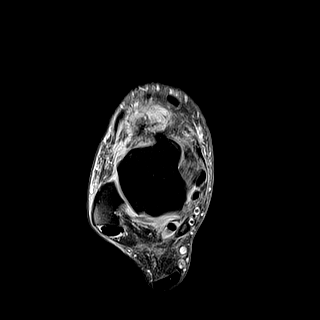
[im 29/37]
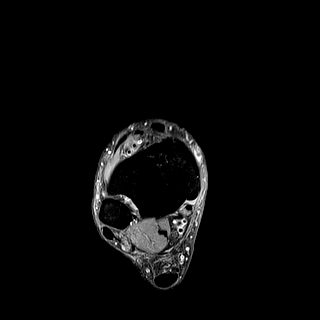
[im 37/37]
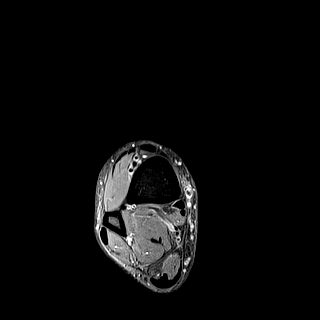

[Series 5: T1 · sagittal · 3.0mm · 0.56mm/px · 4 of 24 slices shown]
[im 1/24]
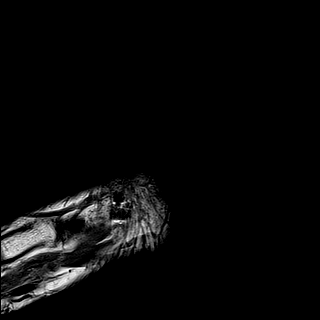
[im 8/24]
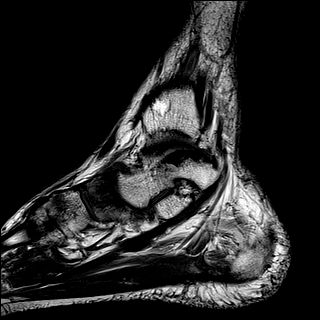
[im 16/24]
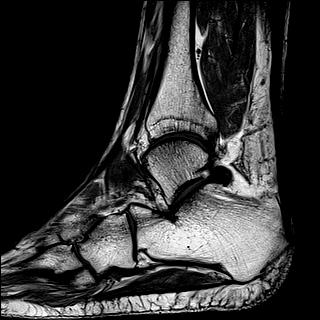
[im 24/24]
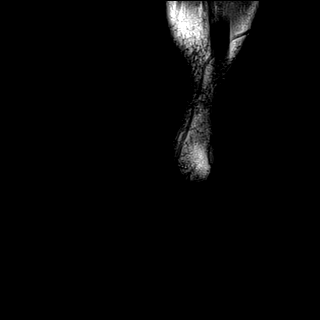

[Series 6: T2 fat-sat · sagittal · 3.0mm · 0.56mm/px · 3 of 24 slices shown (2 of 3)]
[im 1/24]
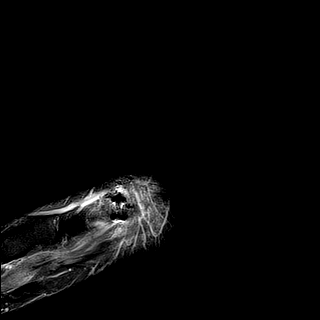
[im 12/24]
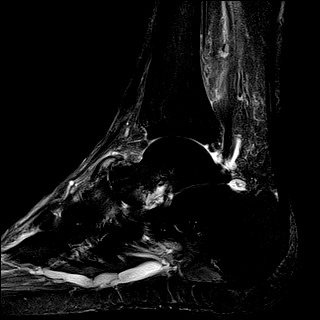
[im 24/24]
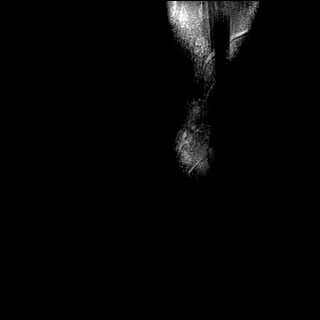

[Series 7: T2 fat-sat · coronal · 3.5mm · 0.56mm/px · 4 of 28 slices shown (3 of 3)]
[im 1/28]
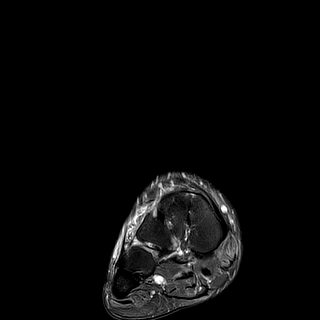
[im 10/28]
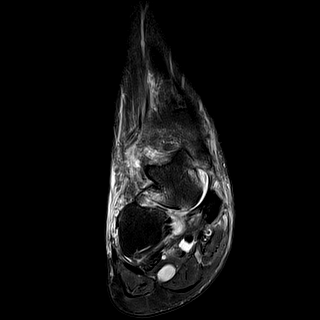
[im 19/28]
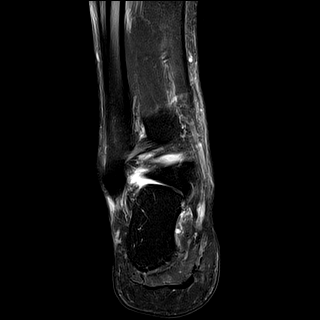
[im 28/28]
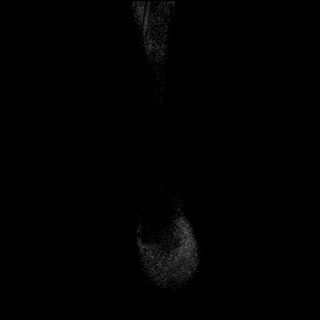

[Series 8: T1 fat-sat · axial · non-contrast · 3.0mm · 0.56mm/px · z∈[-67,+73]mm · 5 of 37 slices shown (1 of 4)]
[im 1/37]
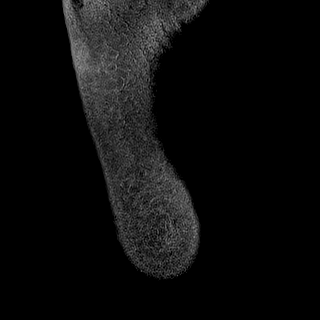
[im 10/37]
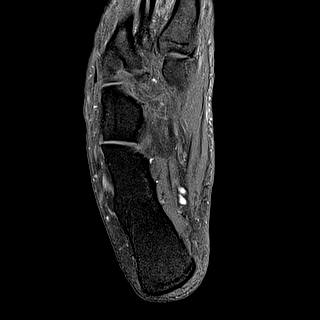
[im 19/37]
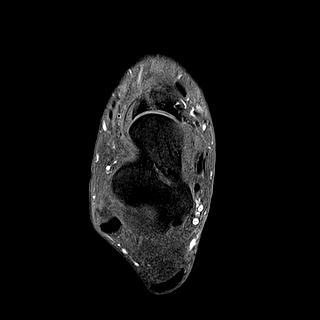
[im 28/37]
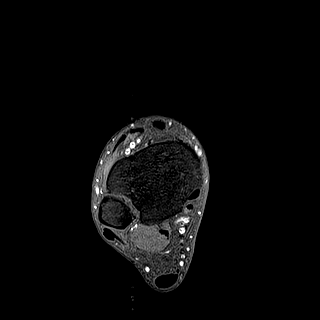
[im 37/37]
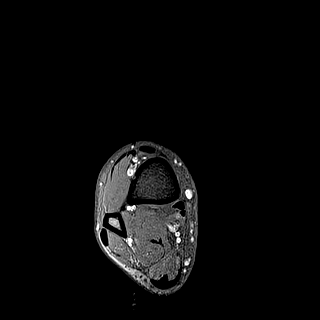

[Series 9: T1 fat-sat · axial · 3.0mm · 0.56mm/px · z∈[-67,+73]mm · 5 of 37 slices shown (2 of 4)]
[im 1/37]
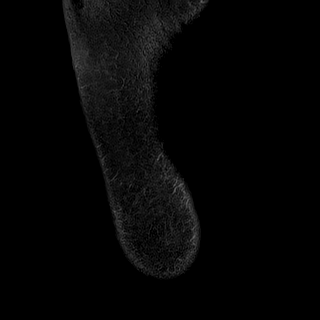
[im 10/37]
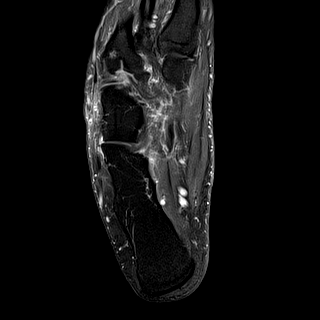
[im 19/37]
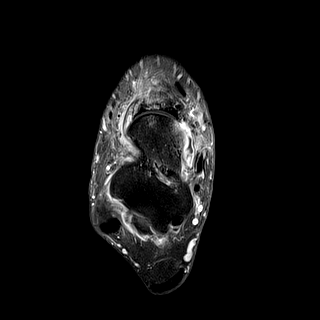
[im 28/37]
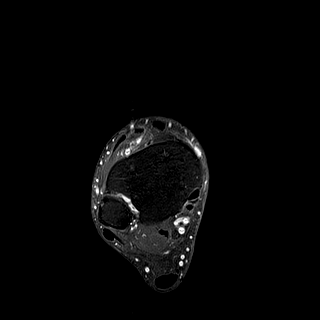
[im 37/37]
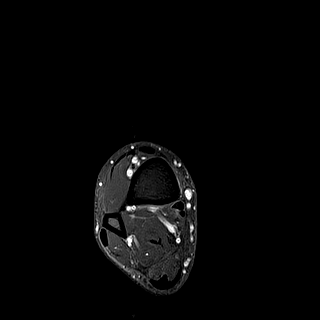

[Series 10: T1 fat-sat · sagittal · 3.0mm · 0.56mm/px · 3 of 24 slices shown (3 of 4)]
[im 1/24]
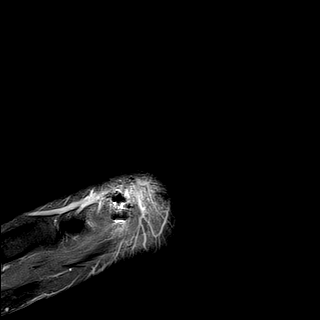
[im 12/24]
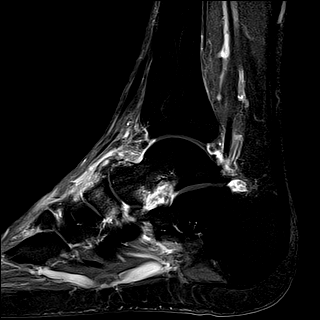
[im 24/24]
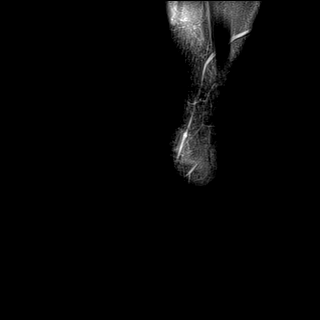

[Series 11: T1 fat-sat · coronal · 3.5mm · 0.56mm/px · 4 of 28 slices shown (4 of 4)]
[im 1/28]
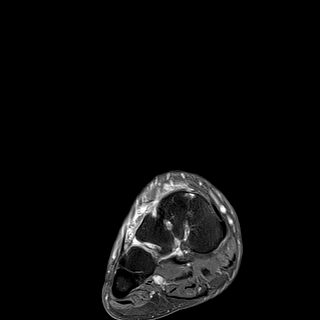
[im 10/28]
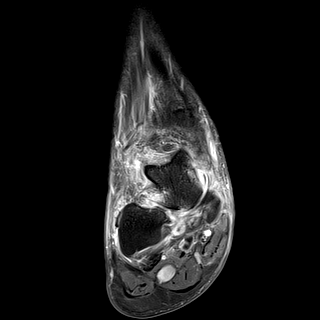
[im 19/28]
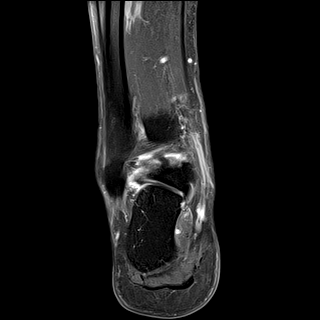
[im 28/28]
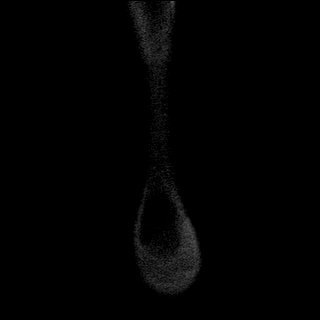

[40 of 40 positions shown; findings below may reference images not displayed]

FINDINGS: TENDONS

Peroneal: Intact and normally positioned.

Posteromedial: Intact and normally positioned. There is small amount
of fluid within the posterior tibialis and flexor hallucis longus
tendon sheaths.

Anterior: Intact and normally positioned.

Achilles: Intact.

Plantar Fascia: Intact.

LIGAMENTS

Lateral: The anterior and posterior talofibular and calcaneofibular
ligaments are intact.

Medial: There are postsurgical changes around the spring ligament
which is mildly thickened. The deltoid ligament appears normal.

CARTILAGE AND BONES

Ankle Joint: No significant ankle joint effusion. The talar dome and
tibial plafond are intact.

Subtalar Joints/Sinus Tarsi: The subtalar joint appears normal.
There is extensive edema and enhancement throughout the tarsal
sinus. This edema and enhancement extend into the surrounding soft
tissues of the midfoot, both medially and laterally.

Bones: The lesion of concern anteriorly in the distal tibia appears
to reflect a postsurgical defect, probably related to donor site
from reported previous navicular surgery. There is postsurgical
deformity of the anterior cortex and a small amount of
susceptibility artifact in this area. There is no abnormal signal or
enhancement within the distal tibia.

The navicular is diffusely abnormal. There is postsurgical
susceptibility artifact medially in the navicular and in the
adjacent medial soft tissues. There is heterogeneous marrow T2
hyperintensity and enhancement throughout the navicular. There is a
linear band of low T1 signal medially in the navicular (axial images
22 and 23 of series 3) suspicious for a recurrent stress fracture.
There is an associated talonavicular joint effusion with synovial
enhancement of dorsal and medial components.

Other: As above, there is diffuse T2 hyperintensity and enhancement
throughout the midfoot, consistent with synovitis. No focal
extra-articular fluid collections are seen.
IMPRESSION: IMPRESSION
1. The lesion of concern on radiographs appears to reflect a
postsurgical defect, probably from the reported previous remote
navicular surgery. This has no worrisome characteristics.
2. Abnormal navicular, in part secondary to previous surgery. There
is diffuse marrow edema and enhancement with suspicion of a
recurrent stress fracture.
3. Associated edema and enhancement throughout the surrounding soft
tissues, including the tarsal sinus consistent with synovitis.
Complex talonavicular joint effusion with synovial enhancement.
There are no suspicious findings within the surrounding bones to
suggest infection.
4. The ankle tendons and ligaments are intact.

## 2019-02-10 DIAGNOSIS — M25512 Pain in left shoulder: Secondary | ICD-10-CM | POA: Diagnosis not present

## 2019-02-15 DIAGNOSIS — Z043 Encounter for examination and observation following other accident: Secondary | ICD-10-CM | POA: Diagnosis not present

## 2019-02-15 DIAGNOSIS — Z713 Dietary counseling and surveillance: Secondary | ICD-10-CM | POA: Diagnosis not present

## 2019-02-15 DIAGNOSIS — R748 Abnormal levels of other serum enzymes: Secondary | ICD-10-CM | POA: Diagnosis not present

## 2019-02-17 DIAGNOSIS — M7542 Impingement syndrome of left shoulder: Secondary | ICD-10-CM | POA: Diagnosis not present

## 2019-02-17 DIAGNOSIS — M25512 Pain in left shoulder: Secondary | ICD-10-CM | POA: Diagnosis not present

## 2019-03-06 ENCOUNTER — Ambulatory Visit: Payer: BLUE CROSS/BLUE SHIELD | Attending: Internal Medicine

## 2019-03-06 DIAGNOSIS — Z23 Encounter for immunization: Secondary | ICD-10-CM | POA: Insufficient documentation

## 2019-03-06 NOTE — Progress Notes (Signed)
   Covid-19 Vaccination Clinic  Name:  Depaul Arizpe    MRN: [id number] DOB: 1965-04-25  03/06/2019  Mr. Weberg was observed post Covid-19 immunization for 15 minutes without incidence. He was provided with Vaccine Information Sheet and instruction to access the V-Safe system.   Mr. Wimmer was instructed to call 911 with any severe reactions post vaccine: Marland Kitchen Difficulty breathing  . Swelling of your face and throat  . A fast heartbeat  . A bad rash all over your body  . Dizziness and weakness    Immunizations Administered    Name Date Dose VIS Date Route   Pfizer COVID-19 Vaccine 03/06/2019  7:09 PM 0.3 mL 12/30/2018 Intramuscular   Manufacturer: [employer], [employer]   Lot: [id number]   NDC: [address]-6

## 2019-03-28 ENCOUNTER — Ambulatory Visit: Payer: Self-pay | Attending: Internal Medicine

## 2019-03-28 DIAGNOSIS — Z0000 Encounter for general adult medical examination without abnormal findings: Secondary | ICD-10-CM | POA: Diagnosis not present

## 2019-03-28 DIAGNOSIS — Z23 Encounter for immunization: Secondary | ICD-10-CM | POA: Insufficient documentation

## 2019-03-28 NOTE — Progress Notes (Signed)
   Covid-19 Vaccination Clinic  Name:  Jonathan Hunter    MRN: [id number] DOB: June 30, 1965  03/28/2019  [name] was observed post Covid-19 immunization for 15 minutes without incident. He was provided with Vaccine Information Sheet and instruction to access the V-Safe system.   [name] was instructed to call 911 with any severe reactions post vaccine: Marland Kitchen Difficulty breathing  . Swelling of face and throat  . A fast heartbeat  . A bad rash all over body  . Dizziness and weakness   Immunizations Administered    Name Date Dose VIS Date Route   Pfizer COVID-19 Vaccine 03/28/2019  1:07 PM 0.3 mL 12/30/2018 Intramuscular   Manufacturer: [employer], [employer]   Lot: [id number]   NDC: [address]-6

## 2019-03-29 ENCOUNTER — Ambulatory Visit: Payer: Self-pay

## 2019-04-17 DIAGNOSIS — M109 Gout, unspecified: Secondary | ICD-10-CM | POA: Diagnosis not present

## 2019-04-17 DIAGNOSIS — E785 Hyperlipidemia, unspecified: Secondary | ICD-10-CM | POA: Diagnosis not present

## 2019-04-17 DIAGNOSIS — Z125 Encounter for screening for malignant neoplasm of prostate: Secondary | ICD-10-CM | POA: Diagnosis not present

## 2019-05-02 DIAGNOSIS — Z0189 Encounter for other specified special examinations: Secondary | ICD-10-CM | POA: Diagnosis not present

## 2019-10-10 DIAGNOSIS — G629 Polyneuropathy, unspecified: Secondary | ICD-10-CM | POA: Diagnosis not present

## 2019-10-10 DIAGNOSIS — M109 Gout, unspecified: Secondary | ICD-10-CM | POA: Diagnosis not present

## 2019-10-10 DIAGNOSIS — I10 Essential (primary) hypertension: Secondary | ICD-10-CM | POA: Diagnosis not present

## 2019-10-13 DIAGNOSIS — L570 Actinic keratosis: Secondary | ICD-10-CM | POA: Diagnosis not present

## 2019-10-13 DIAGNOSIS — C44319 Basal cell carcinoma of skin of other parts of face: Secondary | ICD-10-CM | POA: Diagnosis not present

## 2019-10-13 DIAGNOSIS — D485 Neoplasm of uncertain behavior of skin: Secondary | ICD-10-CM | POA: Diagnosis not present

## 2019-10-31 DIAGNOSIS — C44319 Basal cell carcinoma of skin of other parts of face: Secondary | ICD-10-CM | POA: Diagnosis not present

## 2019-11-02 DIAGNOSIS — D0461 Carcinoma in situ of skin of right upper limb, including shoulder: Secondary | ICD-10-CM | POA: Diagnosis not present

## 2019-11-02 DIAGNOSIS — D2261 Melanocytic nevi of right upper limb, including shoulder: Secondary | ICD-10-CM | POA: Diagnosis not present

## 2019-11-02 DIAGNOSIS — L821 Other seborrheic keratosis: Secondary | ICD-10-CM | POA: Diagnosis not present

## 2019-11-02 DIAGNOSIS — D485 Neoplasm of uncertain behavior of skin: Secondary | ICD-10-CM | POA: Diagnosis not present

## 2019-11-02 DIAGNOSIS — L570 Actinic keratosis: Secondary | ICD-10-CM | POA: Diagnosis not present

## 2019-11-02 DIAGNOSIS — Z85828 Personal history of other malignant neoplasm of skin: Secondary | ICD-10-CM | POA: Diagnosis not present

## 2019-11-02 DIAGNOSIS — L814 Other melanin hyperpigmentation: Secondary | ICD-10-CM | POA: Diagnosis not present

## 2020-01-16 DIAGNOSIS — D0461 Carcinoma in situ of skin of right upper limb, including shoulder: Secondary | ICD-10-CM | POA: Diagnosis not present

## 2020-01-26 DIAGNOSIS — L0390 Cellulitis, unspecified: Secondary | ICD-10-CM | POA: Diagnosis not present

## 2020-03-15 DIAGNOSIS — H5203 Hypermetropia, bilateral: Secondary | ICD-10-CM | POA: Diagnosis not present

## 2020-03-15 DIAGNOSIS — H53143 Visual discomfort, bilateral: Secondary | ICD-10-CM | POA: Diagnosis not present

## 2020-04-01 DIAGNOSIS — Z3009 Encounter for other general counseling and advice on contraception: Secondary | ICD-10-CM | POA: Diagnosis not present

## 2020-05-10 DIAGNOSIS — Z302 Encounter for sterilization: Secondary | ICD-10-CM | POA: Diagnosis not present

## 2020-06-20 DIAGNOSIS — Z0189 Encounter for other specified special examinations: Secondary | ICD-10-CM | POA: Diagnosis not present

## 2020-06-21 DIAGNOSIS — R748 Abnormal levels of other serum enzymes: Secondary | ICD-10-CM | POA: Diagnosis not present

## 2020-06-21 DIAGNOSIS — Z043 Encounter for examination and observation following other accident: Secondary | ICD-10-CM | POA: Diagnosis not present

## 2020-06-21 DIAGNOSIS — Z713 Dietary counseling and surveillance: Secondary | ICD-10-CM | POA: Diagnosis not present

## 2020-11-06 DIAGNOSIS — L821 Other seborrheic keratosis: Secondary | ICD-10-CM | POA: Diagnosis not present

## 2020-11-06 DIAGNOSIS — D485 Neoplasm of uncertain behavior of skin: Secondary | ICD-10-CM | POA: Diagnosis not present

## 2020-11-06 DIAGNOSIS — L570 Actinic keratosis: Secondary | ICD-10-CM | POA: Diagnosis not present

## 2020-11-06 DIAGNOSIS — Z85828 Personal history of other malignant neoplasm of skin: Secondary | ICD-10-CM | POA: Diagnosis not present

## 2020-11-06 DIAGNOSIS — L814 Other melanin hyperpigmentation: Secondary | ICD-10-CM | POA: Diagnosis not present

## 2020-11-06 DIAGNOSIS — D2261 Melanocytic nevi of right upper limb, including shoulder: Secondary | ICD-10-CM | POA: Diagnosis not present

## 2020-11-06 DIAGNOSIS — C44519 Basal cell carcinoma of skin of other part of trunk: Secondary | ICD-10-CM | POA: Diagnosis not present

## 2020-11-14 DIAGNOSIS — E785 Hyperlipidemia, unspecified: Secondary | ICD-10-CM | POA: Diagnosis not present

## 2020-11-14 DIAGNOSIS — N529 Male erectile dysfunction, unspecified: Secondary | ICD-10-CM | POA: Diagnosis not present

## 2020-11-14 DIAGNOSIS — Z23 Encounter for immunization: Secondary | ICD-10-CM | POA: Diagnosis not present

## 2020-11-14 DIAGNOSIS — G629 Polyneuropathy, unspecified: Secondary | ICD-10-CM | POA: Diagnosis not present

## 2020-11-14 DIAGNOSIS — Z125 Encounter for screening for malignant neoplasm of prostate: Secondary | ICD-10-CM | POA: Diagnosis not present

## 2020-11-14 DIAGNOSIS — I10 Essential (primary) hypertension: Secondary | ICD-10-CM | POA: Diagnosis not present

## 2020-11-27 DIAGNOSIS — D045 Carcinoma in situ of skin of trunk: Secondary | ICD-10-CM | POA: Diagnosis not present

## 2020-11-27 DIAGNOSIS — C44519 Basal cell carcinoma of skin of other part of trunk: Secondary | ICD-10-CM | POA: Diagnosis not present

## 2021-02-03 ENCOUNTER — Emergency Department (HOSPITAL_COMMUNITY)
Admission: EM | Admit: 2021-02-03 | Discharge: 2021-02-04 | Disposition: A | Discharge status: Home / Self Care | Payer: BC Managed Care – PPO | Attending: Student | Admitting: Student

## 2021-02-03 ENCOUNTER — Encounter (HOSPITAL_COMMUNITY): Payer: Self-pay

## 2021-02-03 ENCOUNTER — Emergency Department (HOSPITAL_COMMUNITY): Payer: BC Managed Care – PPO

## 2021-02-03 DIAGNOSIS — M5442 Lumbago with sciatica, left side: Secondary | ICD-10-CM | POA: Diagnosis not present

## 2021-02-03 DIAGNOSIS — M549 Dorsalgia, unspecified: Secondary | ICD-10-CM | POA: Diagnosis not present

## 2021-02-03 DIAGNOSIS — I10 Essential (primary) hypertension: Secondary | ICD-10-CM | POA: Insufficient documentation

## 2021-02-03 DIAGNOSIS — M25552 Pain in left hip: Secondary | ICD-10-CM | POA: Diagnosis not present

## 2021-02-03 DIAGNOSIS — Z79899 Other long term (current) drug therapy: Secondary | ICD-10-CM | POA: Diagnosis not present

## 2021-02-03 DIAGNOSIS — M545 Low back pain, unspecified: Secondary | ICD-10-CM | POA: Diagnosis not present

## 2021-02-03 MED ORDER — OXYCODONE-ACETAMINOPHEN 5-325 MG PO TABS
1.0000 | ORAL_TABLET | Freq: Once | ORAL | Status: AC
  Administered 2021-02-03: 1 via ORAL
  Filled 2021-02-03: qty 1

## 2021-02-03 NOTE — ED Triage Notes (Signed)
Patient arrives POV c/o back pain for multiple weeks. Hx of sciatica. No loss of bowel and bladder control. Endorses left leg numbness

## 2021-02-03 NOTE — ED Provider Triage Note (Signed)
Emergency Medicine Provider Triage Evaluation Note  Jonathan Hunter , a 56 y.o. male  was evaluated in triage.  Pt complains of back pain that began 2 weeks prior. Patient believes pain was triggered by lifting a heavy couch. Acutely worsened today around 2PM, having to lay flat. Worse with ambulation and with sitting upset. Having some intermittent paresthesias down the LLE.   Review of Systems  Positive: Back pain, intermittent paresthesias Negative: Incontinence, retention, dysuria, fever  Physical Exam  BP (!) 142/88 (BP Location: Left Arm)    Pulse (!) 50    Temp 98.1 F (36.7 C) (Oral)    Resp 18    SpO2 98%  Gen:   Awake, no distress   Resp:  Normal effort  MSK:   Diffuse midline L spine tenderness as well as some left paraspinal muscles.  Other:   Sensation grossly intact to Les. 5/5 strength with plantar/dorsiflexion bilaterally. 2+ PT Pulses bilaterally. Able to lift both legs off of the bed.    Medical Decision Making  Medically screening exam initiated at 10:29 PM.  Appropriate orders placed.  Moishy Laday was informed that the remainder of the evaluation will be completed by another provider, this initial triage assessment does not replace that evaluation, and the importance of remaining in the ED until their evaluation is complete.  Back pain   Cherly Anderson, PA-C 02/03/21 2232

## 2021-02-04 MED ORDER — OXYCODONE-ACETAMINOPHEN 5-325 MG PO TABS: 1.0000 | ORAL_TABLET | Freq: Four times a day (QID) | ORAL | 0 refills | Status: AC | PRN

## 2021-02-04 MED ORDER — OXYCODONE-ACETAMINOPHEN 5-325 MG PO TABS
1.0000 | ORAL_TABLET | Freq: Once | ORAL | Status: AC
  Administered 2021-02-04: 1 via ORAL
  Filled 2021-02-04: qty 1

## 2021-02-04 MED ORDER — PREDNISONE 20 MG PO TABS: 40.0000 mg | ORAL_TABLET | Freq: Every day | ORAL | 0 refills | Status: AC

## 2021-02-04 NOTE — Discharge Instructions (Addendum)
You are seen in the emergency department today for low back injury.  While you were here we did imaging of your back which showed that there were no acute fractures of your spine or pelvis.  You likely have sciatica along with the low back strain.  You are already on steroids, complete these.  I am prescribing you a short course of pain medication for you to take over the next few days.  You can alternate this with ibuprofen, Advil, Motrin, Aleve.  Please take ibuprofen as prescribed on the bottle. In addition to ibuprofen, I am prescribing you Percocet which is an opioid.  You have been prescribed a medication that is considered an opiate. Opiates are pain medications that should be used with caution. It is important that you do not drive while taking this medication as it can cause drowsiness and impaired reaction times. Do not mix this medication with benzodiazepine medications or alcohol as this can cause respiratory depression. Additionally, opiates have addicting properties to them. Please use medication as prescribed by your provider. Please return to the emergency department if you begin to have any numbness or tingling in your groin, inability to walk, incontinence of your bowel or bladder.  Please follow-up with [facility] which I have provided in your discharge paperwork if you continue to have issues with pain.

## 2021-02-04 NOTE — ED Provider Notes (Signed)
[facility] EMERGENCY DEPARTMENT Provider Note   CSN: [id number] Arrival date & time: 02/03/21  2035     History  Chief Complaint  Patient presents with   Back Pain    Jonathan Hunter is a 56 y.o. male.  With past medical history of hypertension who presents emergency department with back pain.  Patient states that about 2 weeks ago he was lifting furniture with his son.  He states that they were walking with the furniture when the son pulled the couch away from the patient and he was forced forward.  He states that he never felt pain at that time however had some achiness over the next few days.  He states that the night before last he was starting to have increased pain and states that he asked his wife to walk on his back.  He states that then last afternoon around 2 PM he began having severe low back pain that has required him to lay flat.  He states that his sons had to place him in the car so he did not have to stand up.  Since he has been laying flat he has not attempted to ambulate.  He does have some numbness and tingling down the left buttock down to the left toes.  He denies saddle anesthesia, incontinence of bowel or bladder, fevers, previous surgeries.   Back Pain     Home Medications Prior to Admission medications   Medication Sig Start Date End Date Taking? Authorizing Provider  allopurinol (ZYLOPRIM) 300 MG tablet Take 300 mg by mouth daily. 10/22/17   [provider]  Avanafil (STENDRA) 100 MG TABS Stendra 100 mg tablet    [provider]  indomethacin (INDOCIN) 25 MG capsule 1 CAP BY MOUTH 3X A DAY FOR 3 DAYS. THEN 1 CAP 2X A DAY FOR 3 DAYS THEN 1 CAP EVERY DAY FOR 3 DAYS 09/23/16   [provider]  irbesartan (AVAPRO) 300 MG tablet TAKE ONE TABLET BY MOUTH ONE TIME DAILY.MUST FIND [facility] DOCTOR 05/18/16   [provider]  SYMBICORT 80-4.5 MCG/ACT inhaler Inhale 2 puffs into the lungs 2 (two) times daily. 04/09/16   [provider]      Allergies    Patient has no known allergies.    Review of Systems   Review of Systems  Musculoskeletal:  Positive for back pain, gait problem and myalgias.  All other systems reviewed and are negative.  Physical Exam Updated Vital Signs BP 123/74 (BP Location: Left Arm)    Pulse 60    Temp 98.1 F (36.7 C)    Resp 20    SpO2 100%  Physical Exam Vitals and nursing note reviewed.  Constitutional:      General: He is not in acute distress.    Appearance: Normal appearance. He is not ill-appearing or toxic-appearing.  HENT:     Head: Normocephalic and atraumatic.  Eyes:     General: No scleral icterus.    Extraocular Movements: Extraocular movements intact.  Cardiovascular:     Pulses: Normal pulses.  Pulmonary:     Effort: Pulmonary effort is normal. No respiratory distress.  Abdominal:     Palpations: Abdomen is soft.  Musculoskeletal:        General: Tenderness present.     Cervical back: Normal range of motion and neck supple. No tenderness.  Skin:    General: Skin is warm and dry.     Capillary Refill: Capillary refill takes less than  2 seconds.     Findings: No rash.  Neurological:     General: No focal deficit present.     Mental Status: He is alert and oriented to person, place, and time. Mental status is at baseline.     GCS: GCS eye subscore is 4. GCS verbal subscore is 5. GCS motor subscore is 6.     Cranial Nerves: No cranial nerve deficit.     Sensory: No sensory deficit.     Motor: No weakness.     Comments: Positive straight leg raise  Slowed ambulation.  Psychiatric:        Mood and Affect: Mood normal.        Behavior: Behavior normal.        Thought Content: Thought content normal.        Judgment: Judgment normal.    ED Results / Procedures / Treatments   Labs (all labs ordered are listed, but only abnormal results are displayed) Labs Reviewed - No data to display  EKG None  Radiology DG Lumbar Spine Complete  Result  Date: 02/03/2021 CLINICAL DATA:  Acute back pain, initial encounter EXAM: LUMBAR SPINE - COMPLETE 4+ VIEW COMPARISON:  None. FINDINGS: Five lumbar type vertebral bodies are well visualized. Vertebral body height is well maintained. No pars defects are noted. Osteophytic changes are noted at L2-3 and L3-4. No anterolisthesis is noted. IMPRESSION: No acute abnormality noted.  Mild degenerative changes are seen. Electronically Signed   By: Inez Catalina M.D.   On: 02/03/2021 23:21   DG Hip Unilat W or Wo Pelvis 2-3 Views Left  Result Date: 02/03/2021 CLINICAL DATA:  Left hip pain, no known injury, initial encounter EXAM: DG HIP (WITH OR WITHOUT PELVIS) 3V LEFT COMPARISON:  None. FINDINGS: Pelvic ring is intact. No acute fracture or dislocation is noted. No soft tissue abnormality is seen. IMPRESSION: No acute abnormality noted. Electronically Signed   By: Inez Catalina M.D.   On: 02/03/2021 23:22    Procedures Procedures    Medications Ordered in ED Medications  oxyCODONE-acetaminophen (PERCOCET/ROXICET) 5-325 MG per tablet 1 tablet (1 tablet Oral Given 02/03/21 2236)  oxyCODONE-acetaminophen (PERCOCET/ROXICET) 5-325 MG per tablet 1 tablet (1 tablet Oral Given 02/04/21 0913)    ED Course/ Medical Decision Making/ A&P                           Medical Decision Making Risk Prescription drug management.   Patient presents to the ED with complaints of back pain. This involves an extensive number of treatment options, and is a complaint that carries with it a moderate risk of complications and morbidity.   Additional history obtained:  Additional history obtained from: Wife External records from outside source obtained and reviewed including: Previous primary care visits  Imaging Studies ordered:  I ordered imaging studies which included x-ray lumbar spine, x-ray pelvis.  I independently reviewed & interpreted imaging & am in agreement with radiology impression. Imaging shows: Lumbar x-ray with  osteophytic changes to the L2/L3 and L3/L4.  No anterior listhesis.  No acute abnormalities. X-ray pelvis left with pelvic ring intact, no fractures or dislocations.  Medications  Medications were by previous provider include oxycodone x2 Reevaluation of the patient after medication shows that patient improved  Tests Considered: CT lumbar spine  ED Course: 56 year old male who presents emergency department for low back pain.  States that it is progressively worsened since he had injury with lifting a couch.  His physical exam is significant for positive straight leg raise on the left.  Otherwise he is neurologically intact.  He has strength 5/5 in bilateral upper and lower extremities.  Pain improved somewhat with oxycodone  He was able to stand and ambulate on my evaluation  Problem List:  Low back pain Likely musculoskeletal strain with radiculopathy Imaging negative No red flags or cauda equina, no concerns for epidural abscess, osteomyelitis symptoms requiring further work-up or intervention Patient is already taking prednisone provided by previous provider for same symptoms.  Dispostion:  After consideration of the diagnostic results and the patients response to treatment, I feel that the patent would benefit from discharge with steroids, pain medication, and neurosurgical consult as needed.  Patient has already been prescribed steroids at outpatient to be tapered due to the pain that he is currently having.  I prescribed him prednisone here in case he does not actually have the prescription as I am not able to see it in his chart.  He is instructed to complete it.  I am also giving him a short course of Percocet for pain.  He is instructed return to the emergency department should he begin having any cauda equina symptoms that we reviewed at the bedside.  He was able to teach this back to me.  Also given referral to neurosurgery should he have ongoing symptoms of pain that are  concerning to him as they can provide definitive management of his symptoms.  He verbalized understanding.  His vital signs are stable.  He is able to ambulate.  Safe for discharge..   Final Clinical Impression(s) / ED Diagnoses Final diagnoses:  Acute bilateral low back pain with left-sided sciatica    Rx / DC Orders ED Discharge Orders          Ordered    oxyCODONE-acetaminophen (PERCOCET/ROXICET) 5-325 MG tablet  Every 6 hours PRN        02/04/21 0936    predniSONE (DELTASONE) 20 MG tablet  Daily        02/04/21 0936              Mickie Hillier, PA-C 02/04/21 1756    Milton Ferguson, MD 02/06/21 1051

## 2021-02-07 DIAGNOSIS — M9902 Segmental and somatic dysfunction of thoracic region: Secondary | ICD-10-CM | POA: Diagnosis not present

## 2021-02-07 DIAGNOSIS — M9903 Segmental and somatic dysfunction of lumbar region: Secondary | ICD-10-CM | POA: Diagnosis not present

## 2021-02-07 DIAGNOSIS — R202 Paresthesia of skin: Secondary | ICD-10-CM | POA: Diagnosis not present

## 2021-02-07 DIAGNOSIS — M5430 Sciatica, unspecified side: Secondary | ICD-10-CM | POA: Diagnosis not present

## 2021-02-07 DIAGNOSIS — M5116 Intervertebral disc disorders with radiculopathy, lumbar region: Secondary | ICD-10-CM | POA: Diagnosis not present

## 2021-02-07 DIAGNOSIS — M5432 Sciatica, left side: Secondary | ICD-10-CM | POA: Diagnosis not present

## 2021-02-07 DIAGNOSIS — M5442 Lumbago with sciatica, left side: Secondary | ICD-10-CM | POA: Diagnosis not present

## 2021-02-07 DIAGNOSIS — M9905 Segmental and somatic dysfunction of pelvic region: Secondary | ICD-10-CM | POA: Diagnosis not present

## 2021-02-11 DIAGNOSIS — M9903 Segmental and somatic dysfunction of lumbar region: Secondary | ICD-10-CM | POA: Diagnosis not present

## 2021-02-11 DIAGNOSIS — M5432 Sciatica, left side: Secondary | ICD-10-CM | POA: Diagnosis not present

## 2021-02-11 DIAGNOSIS — M9902 Segmental and somatic dysfunction of thoracic region: Secondary | ICD-10-CM | POA: Diagnosis not present

## 2021-02-11 DIAGNOSIS — M5116 Intervertebral disc disorders with radiculopathy, lumbar region: Secondary | ICD-10-CM | POA: Diagnosis not present

## 2021-02-11 DIAGNOSIS — M9905 Segmental and somatic dysfunction of pelvic region: Secondary | ICD-10-CM | POA: Diagnosis not present

## 2021-02-13 DIAGNOSIS — M9903 Segmental and somatic dysfunction of lumbar region: Secondary | ICD-10-CM | POA: Diagnosis not present

## 2021-02-13 DIAGNOSIS — M9905 Segmental and somatic dysfunction of pelvic region: Secondary | ICD-10-CM | POA: Diagnosis not present

## 2021-02-13 DIAGNOSIS — M5432 Sciatica, left side: Secondary | ICD-10-CM | POA: Diagnosis not present

## 2021-02-13 DIAGNOSIS — M5116 Intervertebral disc disorders with radiculopathy, lumbar region: Secondary | ICD-10-CM | POA: Diagnosis not present

## 2021-02-13 DIAGNOSIS — M9902 Segmental and somatic dysfunction of thoracic region: Secondary | ICD-10-CM | POA: Diagnosis not present

## 2021-02-14 DIAGNOSIS — M5442 Lumbago with sciatica, left side: Secondary | ICD-10-CM | POA: Diagnosis not present

## 2021-02-14 DIAGNOSIS — M5417 Radiculopathy, lumbosacral region: Secondary | ICD-10-CM | POA: Diagnosis not present

## 2021-02-17 DIAGNOSIS — M9905 Segmental and somatic dysfunction of pelvic region: Secondary | ICD-10-CM | POA: Diagnosis not present

## 2021-02-17 DIAGNOSIS — M5116 Intervertebral disc disorders with radiculopathy, lumbar region: Secondary | ICD-10-CM | POA: Diagnosis not present

## 2021-02-17 DIAGNOSIS — M9903 Segmental and somatic dysfunction of lumbar region: Secondary | ICD-10-CM | POA: Diagnosis not present

## 2021-02-17 DIAGNOSIS — M9902 Segmental and somatic dysfunction of thoracic region: Secondary | ICD-10-CM | POA: Diagnosis not present

## 2021-02-19 DIAGNOSIS — M9902 Segmental and somatic dysfunction of thoracic region: Secondary | ICD-10-CM | POA: Diagnosis not present

## 2021-02-19 DIAGNOSIS — M9905 Segmental and somatic dysfunction of pelvic region: Secondary | ICD-10-CM | POA: Diagnosis not present

## 2021-02-19 DIAGNOSIS — M5116 Intervertebral disc disorders with radiculopathy, lumbar region: Secondary | ICD-10-CM | POA: Diagnosis not present

## 2021-02-19 DIAGNOSIS — M9903 Segmental and somatic dysfunction of lumbar region: Secondary | ICD-10-CM | POA: Diagnosis not present

## 2021-02-24 DIAGNOSIS — M9903 Segmental and somatic dysfunction of lumbar region: Secondary | ICD-10-CM | POA: Diagnosis not present

## 2021-02-24 DIAGNOSIS — M9905 Segmental and somatic dysfunction of pelvic region: Secondary | ICD-10-CM | POA: Diagnosis not present

## 2021-02-24 DIAGNOSIS — M9902 Segmental and somatic dysfunction of thoracic region: Secondary | ICD-10-CM | POA: Diagnosis not present

## 2021-02-24 DIAGNOSIS — M5116 Intervertebral disc disorders with radiculopathy, lumbar region: Secondary | ICD-10-CM | POA: Diagnosis not present

## 2021-02-25 DIAGNOSIS — M5417 Radiculopathy, lumbosacral region: Secondary | ICD-10-CM | POA: Diagnosis not present

## 2021-02-25 DIAGNOSIS — M545 Low back pain, unspecified: Secondary | ICD-10-CM | POA: Diagnosis not present

## 2021-02-26 DIAGNOSIS — M5432 Sciatica, left side: Secondary | ICD-10-CM | POA: Diagnosis not present

## 2021-02-26 DIAGNOSIS — M9902 Segmental and somatic dysfunction of thoracic region: Secondary | ICD-10-CM | POA: Diagnosis not present

## 2021-02-26 DIAGNOSIS — M9905 Segmental and somatic dysfunction of pelvic region: Secondary | ICD-10-CM | POA: Diagnosis not present

## 2021-02-26 DIAGNOSIS — M9903 Segmental and somatic dysfunction of lumbar region: Secondary | ICD-10-CM | POA: Diagnosis not present

## 2021-03-04 DIAGNOSIS — Z6826 Body mass index (BMI) 26.0-26.9, adult: Secondary | ICD-10-CM | POA: Diagnosis not present

## 2021-03-04 DIAGNOSIS — I10 Essential (primary) hypertension: Secondary | ICD-10-CM | POA: Diagnosis not present

## 2021-03-04 DIAGNOSIS — M5116 Intervertebral disc disorders with radiculopathy, lumbar region: Secondary | ICD-10-CM | POA: Diagnosis not present

## 2021-03-24 DIAGNOSIS — M5116 Intervertebral disc disorders with radiculopathy, lumbar region: Secondary | ICD-10-CM | POA: Diagnosis not present

## 2021-05-19 DIAGNOSIS — Z125 Encounter for screening for malignant neoplasm of prostate: Secondary | ICD-10-CM | POA: Diagnosis not present

## 2021-05-19 DIAGNOSIS — R202 Paresthesia of skin: Secondary | ICD-10-CM | POA: Diagnosis not present

## 2021-05-19 DIAGNOSIS — Z23 Encounter for immunization: Secondary | ICD-10-CM | POA: Diagnosis not present

## 2021-05-19 DIAGNOSIS — I10 Essential (primary) hypertension: Secondary | ICD-10-CM | POA: Diagnosis not present

## 2021-05-19 DIAGNOSIS — M109 Gout, unspecified: Secondary | ICD-10-CM | POA: Diagnosis not present

## 2021-05-19 DIAGNOSIS — Z0000 Encounter for general adult medical examination without abnormal findings: Secondary | ICD-10-CM | POA: Diagnosis not present

## 2021-05-19 DIAGNOSIS — N529 Male erectile dysfunction, unspecified: Secondary | ICD-10-CM | POA: Diagnosis not present

## 2021-05-19 DIAGNOSIS — E785 Hyperlipidemia, unspecified: Secondary | ICD-10-CM | POA: Diagnosis not present

## 2021-07-18 DIAGNOSIS — M5116 Intervertebral disc disorders with radiculopathy, lumbar region: Secondary | ICD-10-CM | POA: Diagnosis not present

## 2021-09-03 DIAGNOSIS — S99922A Unspecified injury of left foot, initial encounter: Secondary | ICD-10-CM | POA: Diagnosis not present

## 2021-09-03 DIAGNOSIS — S93602A Unspecified sprain of left foot, initial encounter: Secondary | ICD-10-CM | POA: Diagnosis not present

## 2021-10-31 DIAGNOSIS — M5116 Intervertebral disc disorders with radiculopathy, lumbar region: Secondary | ICD-10-CM | POA: Diagnosis not present

## 2021-11-24 DIAGNOSIS — L570 Actinic keratosis: Secondary | ICD-10-CM | POA: Diagnosis not present

## 2021-11-24 DIAGNOSIS — L821 Other seborrheic keratosis: Secondary | ICD-10-CM | POA: Diagnosis not present

## 2021-11-24 DIAGNOSIS — D2261 Melanocytic nevi of right upper limb, including shoulder: Secondary | ICD-10-CM | POA: Diagnosis not present

## 2021-11-24 DIAGNOSIS — L814 Other melanin hyperpigmentation: Secondary | ICD-10-CM | POA: Diagnosis not present

## 2021-11-25 DIAGNOSIS — Z23 Encounter for immunization: Secondary | ICD-10-CM | POA: Diagnosis not present

## 2022-08-11 DIAGNOSIS — I10 Essential (primary) hypertension: Secondary | ICD-10-CM | POA: Diagnosis not present

## 2022-08-11 DIAGNOSIS — M109 Gout, unspecified: Secondary | ICD-10-CM | POA: Diagnosis not present

## 2022-08-11 DIAGNOSIS — H9319 Tinnitus, unspecified ear: Secondary | ICD-10-CM | POA: Diagnosis not present

## 2022-08-11 DIAGNOSIS — N529 Male erectile dysfunction, unspecified: Secondary | ICD-10-CM | POA: Diagnosis not present

## 2023-02-03 DIAGNOSIS — L821 Other seborrheic keratosis: Secondary | ICD-10-CM | POA: Diagnosis not present

## 2023-02-03 DIAGNOSIS — L570 Actinic keratosis: Secondary | ICD-10-CM | POA: Diagnosis not present

## 2023-02-03 DIAGNOSIS — L814 Other melanin hyperpigmentation: Secondary | ICD-10-CM | POA: Diagnosis not present

## 2023-02-03 DIAGNOSIS — Z85828 Personal history of other malignant neoplasm of skin: Secondary | ICD-10-CM | POA: Diagnosis not present

## 2023-02-03 DIAGNOSIS — D2261 Melanocytic nevi of right upper limb, including shoulder: Secondary | ICD-10-CM | POA: Diagnosis not present

## 2023-02-19 DIAGNOSIS — J069 Acute upper respiratory infection, unspecified: Secondary | ICD-10-CM | POA: Diagnosis not present

## 2023-04-27 DIAGNOSIS — D485 Neoplasm of uncertain behavior of skin: Secondary | ICD-10-CM | POA: Diagnosis not present

## 2023-04-27 DIAGNOSIS — C44229 Squamous cell carcinoma of skin of left ear and external auricular canal: Secondary | ICD-10-CM | POA: Diagnosis not present

## 2023-05-17 DIAGNOSIS — C44229 Squamous cell carcinoma of skin of left ear and external auricular canal: Secondary | ICD-10-CM | POA: Diagnosis not present

## 2023-05-24 DIAGNOSIS — Z0189 Encounter for other specified special examinations: Secondary | ICD-10-CM | POA: Diagnosis not present

## 2023-06-08 DIAGNOSIS — Z5189 Encounter for other specified aftercare: Secondary | ICD-10-CM | POA: Diagnosis not present

## 2023-06-08 DIAGNOSIS — L570 Actinic keratosis: Secondary | ICD-10-CM | POA: Diagnosis not present

## 2023-07-13 DIAGNOSIS — L570 Actinic keratosis: Secondary | ICD-10-CM | POA: Diagnosis not present

## 2023-09-23 DIAGNOSIS — H2513 Age-related nuclear cataract, bilateral: Secondary | ICD-10-CM | POA: Diagnosis not present

## 2023-09-23 DIAGNOSIS — H524 Presbyopia: Secondary | ICD-10-CM | POA: Diagnosis not present

## 2023-12-10 DIAGNOSIS — M109 Gout, unspecified: Secondary | ICD-10-CM | POA: Diagnosis not present

## 2023-12-10 DIAGNOSIS — I10 Essential (primary) hypertension: Secondary | ICD-10-CM | POA: Diagnosis not present

## 2023-12-10 DIAGNOSIS — M79671 Pain in right foot: Secondary | ICD-10-CM | POA: Diagnosis not present

## 2023-12-10 DIAGNOSIS — E785 Hyperlipidemia, unspecified: Secondary | ICD-10-CM | POA: Diagnosis not present

## 2024-01-06 ENCOUNTER — Ambulatory Visit: Admitting: Podiatry

## 2024-01-11 ENCOUNTER — Ambulatory Visit (INDEPENDENT_AMBULATORY_CARE_PROVIDER_SITE_OTHER)

## 2024-01-11 ENCOUNTER — Ambulatory Visit: Admitting: Podiatry

## 2024-01-11 ENCOUNTER — Encounter: Payer: Self-pay | Admitting: Podiatry

## 2024-01-11 DIAGNOSIS — M7742 Metatarsalgia, left foot: Secondary | ICD-10-CM

## 2024-01-11 DIAGNOSIS — M7740 Metatarsalgia, unspecified foot: Secondary | ICD-10-CM

## 2024-01-11 DIAGNOSIS — M7741 Metatarsalgia, right foot: Secondary | ICD-10-CM

## 2024-01-11 NOTE — Progress Notes (Signed)
 Patient scanned today after his visit with Dr. Verta   Patient will benefit from custom foot orthotics to provide total contact to bilateral medial longitudinal arches to help balance and distribute body weight more evenly.  Thus reducing plantar pressure and pain.   Orthotic will encourage forefoot and rearfoot alignment.    Patient was scanned today with [facility] scanner.    Orthotics are ordered.   Signature obtained for notification of pricing/ fees for the device.  When the orthotic is ready for pick up, will call to make an appointment for a fitting.    Eathel Pajak, DPM

## 2024-01-11 NOTE — Progress Notes (Signed)
"  °  Subjective:  Patient ID: Jonathan Hunter, male    DOB: 1965-11-20,  MRN: [id number] HPI Chief Complaint  Patient presents with   Foot Pain    5th met base left - aching x months, active walker about 2-3 hours a day, tingling sensations in both, balance issues as well, feels like there is no cushion under his feet, no treatment, does wear orthotics from 20 years ago, but they have worn out a bit   New Patient (Initial Visit)    Est pt 2018    58 y.o. male presents with the above complaint.   ROS: Denies fever chills nausea mobic muscle aches pains calf pain back pain chest pain shortness of breath  Past Medical History:  Diagnosis Date   Asthma    Hypertension    Past Surgical History:  Procedure Laterality Date   FOOT SURGERY     Current Medications[1]  Allergies[2] Review of Systems Objective:  There were no vitals filed for this visit.  General: Well developed, nourished, in no acute distress, alert and oriented x3   Dermatological: Skin is warm, dry and supple bilateral. Nails x 10 are well maintained; remaining integument appears unremarkable at this time. There are no open sores, no preulcerative lesions, no rash or signs of infection present.  Vascular: Dorsalis Pedis artery and Posterior Tibial artery pedal pulses are 2/4 bilateral with immedate capillary fill time. Pedal hair growth present. No varicosities and no lower extremity edema present bilateral.   Neruologic: Grossly intact via light touch bilateral. Vibratory intact via tuning fork bilateral. Protective threshold with Semmes Wienstein monofilament intact to all pedal sites bilateral. Patellar and Achilles deep tendon reflexes 2+ bilateral. No Babinski or clonus noted bilateral.   Musculoskeletal: No gross boney pedal deformities bilateral. No pain, crepitus, or limitation noted with foot and ankle range of motion bilateral. Muscular strength 5/5 in all groups tested bilateral.  Painful fifth metatarsal base  bilaterally left greater than right  Gait: Unassisted, Nonantalgic.    Radiographs:  Radiographs taken today demonstrate osseously mature individual with good bone mineralization.  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  Plan: Regarding aspirin neuropathy panel and have him scanned today for orthotics.       Kamill Fulbright T. Sanaia Jasso, DPM    [1]  Current Outpatient Medications:    sildenafil (VIAGRA) 50 MG tablet, Take 50 mg by mouth every 4 (four) hours as needed., Disp: , Rfl:    acyclovir (ZOVIRAX) 400 MG tablet, Take 400 mg by mouth 3 (three) times daily as needed., Disp: , Rfl:    allopurinol (ZYLOPRIM) 300 MG tablet, Take 300 mg by mouth daily., Disp: , Rfl: 3   Avanafil (STENDRA) 100 MG TABS, Stendra 100 mg tablet, Disp: , Rfl:    indomethacin (INDOCIN) 25 MG capsule, 1 CAP BY MOUTH 3X A DAY FOR 3 DAYS. THEN 1 CAP 2X A DAY FOR 3 DAYS THEN 1 CAP EVERY DAY FOR 3 DAYS, Disp: , Rfl: 1   irbesartan (AVAPRO) 300 MG tablet, TAKE ONE TABLET BY MOUTH ONE TIME DAILY.MUST FIND [facility] DOCTOR, Disp: , Rfl: 2   SYMBICORT 80-4.5 MCG/ACT inhaler, Inhale 2 puffs into the lungs 2 (two) times daily., Disp: , Rfl: 5 [2] No Known Allergies  "

## 2024-01-30 LAB — HEMOGLOBIN A1C
Hgb A1c MFr Bld: 5.1 %
Mean Plasma Glucose: 100 mg/dL
eAG (mmol/L): 5.5 mmol/L

## 2024-01-30 LAB — CBC WITH DIFFERENTIAL/PLATELET
Absolute Lymphocytes: 1921 {cells}/uL (ref 850–3900)
Absolute Monocytes: 576 {cells}/uL (ref 200–950)
Basophils Absolute: 108 {cells}/uL (ref 0–200)
Basophils Relative: 1.9 %
Eosinophils Absolute: 473 {cells}/uL (ref 15–500)
Eosinophils Relative: 8.3 %
HCT: 43.6 % (ref 39.4–51.1)
Hemoglobin: 14.2 g/dL (ref 13.2–17.1)
MCH: 32 pg (ref 27.0–33.0)
MCHC: 32.6 g/dL (ref 31.6–35.4)
MCV: 98.2 fL (ref 81.4–101.7)
MPV: 12.4 fL (ref 7.5–12.5)
Monocytes Relative: 10.1 %
Neutro Abs: 2622 {cells}/uL (ref 1500–7800)
Neutrophils Relative %: 46 %
Platelets: 183 Thousand/uL (ref 140–400)
RBC: 4.44 Million/uL (ref 4.20–5.80)
RDW: 12.1 % (ref 11.0–15.0)
Total Lymphocyte: 33.7 %
WBC: 5.7 Thousand/uL (ref 3.8–10.8)

## 2024-01-30 LAB — ANTI-NUCLEAR AB-TITER (ANA TITER): ANA Titer 1: 1:40 {titer} — ABNORMAL HIGH

## 2024-01-30 LAB — B12 AND FOLATE PANEL
Folate: 15.5 ng/mL
Vitamin B-12: 478 pg/mL (ref 200–1100)

## 2024-01-30 LAB — TSH: TSH: 2.5 m[IU]/L (ref 0.40–4.50)

## 2024-01-30 LAB — SEDIMENTATION RATE: Sed Rate: 2 mm/h (ref 0–20)

## 2024-01-30 LAB — ANA: Anti Nuclear Antibody (ANA): POSITIVE — AB

## 2024-01-31 ENCOUNTER — Ambulatory Visit: Payer: Self-pay | Admitting: Podiatry

## 2024-01-31 ENCOUNTER — Other Ambulatory Visit: Payer: Self-pay | Admitting: Lab

## 2024-01-31 DIAGNOSIS — M7740 Metatarsalgia, unspecified foot: Secondary | ICD-10-CM

## 2024-01-31 DIAGNOSIS — R899 Unspecified abnormal finding in specimens from other organs, systems and tissues: Secondary | ICD-10-CM

## 2024-01-31 NOTE — Progress Notes (Signed)
 Spoke to patient referral sent to [facility]  336 [phone] fax [phone].

## 2024-02-04 ENCOUNTER — Telehealth: Payer: Self-pay | Admitting: Podiatry

## 2024-02-04 NOTE — Telephone Encounter (Signed)
 Orthotics in [place]22 [place]

## 2024-02-08 ENCOUNTER — Telehealth: Payer: Self-pay

## 2024-02-08 NOTE — Telephone Encounter (Signed)
 Appointment sent on mychart.

## 2024-02-08 NOTE — Telephone Encounter (Signed)
 Patient called and left a message, asking about his rheumatology appointment. Tried to call him back but no answer and mailbox full. 'Rheumatology appointment is as follows:  [facility] [place], [place] [address], [address] Phone [phone]  03/14/24 @ 8:00 am

## 2024-02-10 ENCOUNTER — Ambulatory Visit: Admitting: Podiatry

## 2024-02-10 ENCOUNTER — Ambulatory Visit

## 2024-02-10 DIAGNOSIS — M7750 Other enthesopathy of unspecified foot: Secondary | ICD-10-CM

## 2024-02-10 DIAGNOSIS — M7740 Metatarsalgia, unspecified foot: Secondary | ICD-10-CM

## 2024-02-10 NOTE — Progress Notes (Signed)

## 2024-03-14 ENCOUNTER — Ambulatory Visit
